# Patient Record
Sex: Male | Born: 1935 | Race: White | Hispanic: No | Marital: Married | Smoking: Former smoker
Health system: Southern US, Community
[De-identification: ages and names within clinical notes are randomized; demographics above are authoritative.]

## PROBLEM LIST (undated history)

## (undated) DIAGNOSIS — G7000 Myasthenia gravis without (acute) exacerbation: Secondary | ICD-10-CM

## (undated) DIAGNOSIS — K5710 Diverticulosis of small intestine without perforation or abscess without bleeding: Secondary | ICD-10-CM

## (undated) DIAGNOSIS — L0390 Cellulitis, unspecified: Secondary | ICD-10-CM

## (undated) DIAGNOSIS — I10 Essential (primary) hypertension: Secondary | ICD-10-CM

## (undated) HISTORY — PX: ABDOMINAL SURGERY: SHX537

## (undated) HISTORY — PX: CHOLECYSTECTOMY: SHX55

## (undated) HISTORY — PX: EYE SURGERY: SHX253

---

## 2000-08-06 ENCOUNTER — Ambulatory Visit (HOSPITAL_COMMUNITY): Admission: RE | Admit: 2000-08-06 | Discharge: 2000-08-06 | Payer: Self-pay | Admitting: *Deleted

## 2000-08-06 ENCOUNTER — Encounter: Payer: Self-pay | Admitting: *Deleted

## 2003-05-20 ENCOUNTER — Inpatient Hospital Stay (HOSPITAL_COMMUNITY): Admission: EM | Admit: 2003-05-20 | Discharge: 2003-05-24 | Payer: Self-pay | Admitting: Emergency Medicine

## 2003-05-24 ENCOUNTER — Encounter (INDEPENDENT_AMBULATORY_CARE_PROVIDER_SITE_OTHER): Payer: Self-pay | Admitting: Cardiology

## 2004-06-05 ENCOUNTER — Ambulatory Visit: Payer: Self-pay | Admitting: Family Medicine

## 2004-07-02 HISTORY — PX: SMALL INTESTINE SURGERY: SHX150

## 2004-07-10 ENCOUNTER — Ambulatory Visit: Payer: Self-pay | Admitting: Internal Medicine

## 2004-07-10 ENCOUNTER — Inpatient Hospital Stay (HOSPITAL_COMMUNITY): Admission: EM | Admit: 2004-07-10 | Discharge: 2004-07-18 | Payer: Self-pay | Admitting: Emergency Medicine

## 2004-07-13 ENCOUNTER — Encounter (INDEPENDENT_AMBULATORY_CARE_PROVIDER_SITE_OTHER): Payer: Self-pay | Admitting: *Deleted

## 2004-07-25 ENCOUNTER — Ambulatory Visit: Payer: Self-pay | Admitting: Family Medicine

## 2004-09-13 ENCOUNTER — Ambulatory Visit: Payer: Self-pay | Admitting: Family Medicine

## 2004-12-14 ENCOUNTER — Ambulatory Visit: Payer: Self-pay | Admitting: Family Medicine

## 2005-03-21 ENCOUNTER — Ambulatory Visit: Payer: Self-pay | Admitting: Family Medicine

## 2005-08-23 ENCOUNTER — Ambulatory Visit: Payer: Self-pay | Admitting: Family Medicine

## 2005-12-25 ENCOUNTER — Ambulatory Visit: Payer: Self-pay | Admitting: Family Medicine

## 2006-04-30 ENCOUNTER — Ambulatory Visit: Payer: Self-pay | Admitting: Family Medicine

## 2006-09-24 ENCOUNTER — Emergency Department (HOSPITAL_COMMUNITY): Admission: EM | Admit: 2006-09-24 | Discharge: 2006-09-25 | Payer: Self-pay | Admitting: Emergency Medicine

## 2006-09-25 ENCOUNTER — Ambulatory Visit: Payer: Self-pay | Admitting: Family Medicine

## 2006-09-30 ENCOUNTER — Ambulatory Visit: Payer: Self-pay | Admitting: Family Medicine

## 2006-10-10 ENCOUNTER — Encounter: Admission: RE | Admit: 2006-10-10 | Discharge: 2006-10-10 | Payer: Self-pay | Admitting: Surgery

## 2008-09-01 ENCOUNTER — Encounter (INDEPENDENT_AMBULATORY_CARE_PROVIDER_SITE_OTHER): Payer: Self-pay | Admitting: General Surgery

## 2008-09-01 ENCOUNTER — Inpatient Hospital Stay (HOSPITAL_COMMUNITY): Admission: EM | Admit: 2008-09-01 | Discharge: 2008-09-02 | Payer: Self-pay | Admitting: Emergency Medicine

## 2010-07-23 ENCOUNTER — Encounter: Payer: Self-pay | Admitting: Neurology

## 2010-10-12 LAB — DIFFERENTIAL
Basophils Relative: 0 % (ref 0–1)
Lymphocytes Relative: 8 % — ABNORMAL LOW (ref 12–46)
Lymphs Abs: 1.2 10*3/uL (ref 0.7–4.0)
Monocytes Absolute: 1 10*3/uL (ref 0.1–1.0)
Monocytes Relative: 7 % (ref 3–12)
Neutro Abs: 13 10*3/uL — ABNORMAL HIGH (ref 1.7–7.7)

## 2010-10-12 LAB — COMPREHENSIVE METABOLIC PANEL
Albumin: 3.4 g/dL — ABNORMAL LOW (ref 3.5–5.2)
Alkaline Phosphatase: 59 U/L (ref 39–117)
BUN: 7 mg/dL (ref 6–23)
Calcium: 8.5 mg/dL (ref 8.4–10.5)
Potassium: 3.1 mEq/L — ABNORMAL LOW (ref 3.5–5.1)
Total Protein: 6 g/dL (ref 6.0–8.3)

## 2010-10-12 LAB — URINALYSIS, ROUTINE W REFLEX MICROSCOPIC
Glucose, UA: NEGATIVE mg/dL
Hgb urine dipstick: NEGATIVE
Specific Gravity, Urine: 1.025 (ref 1.005–1.030)
pH: 7 (ref 5.0–8.0)

## 2010-10-12 LAB — CBC
HCT: 45 % (ref 39.0–52.0)
MCHC: 34.6 g/dL (ref 30.0–36.0)
Platelets: 312 10*3/uL (ref 150–400)
RDW: 13.8 % (ref 11.5–15.5)

## 2010-10-26 ENCOUNTER — Inpatient Hospital Stay (HOSPITAL_COMMUNITY)
Admission: EM | Admit: 2010-10-26 | Discharge: 2010-10-27 | DRG: 603 | Disposition: A | Discharge status: Home / Self Care | Payer: Medicare Other | Source: Ambulatory Visit | Attending: Emergency Medicine | Admitting: Emergency Medicine

## 2010-10-26 DIAGNOSIS — I10 Essential (primary) hypertension: Secondary | ICD-10-CM | POA: Diagnosis present

## 2010-10-26 DIAGNOSIS — L02419 Cutaneous abscess of limb, unspecified: Principal | ICD-10-CM | POA: Diagnosis present

## 2010-10-27 ENCOUNTER — Emergency Department (HOSPITAL_COMMUNITY): Payer: Medicare Other

## 2010-10-27 LAB — DIFFERENTIAL
Lymphs Abs: 3.8 10*3/uL (ref 0.7–4.0)
Monocytes Relative: 8 % (ref 3–12)
Neutro Abs: 9.2 10*3/uL — ABNORMAL HIGH (ref 1.7–7.7)
Neutrophils Relative %: 65 % (ref 43–77)

## 2010-10-27 LAB — CBC
Hemoglobin: 15.6 g/dL (ref 13.0–17.0)
MCH: 32.7 pg (ref 26.0–34.0)
MCV: 93.1 fL (ref 78.0–100.0)
RBC: 4.77 MIL/uL (ref 4.22–5.81)
WBC: 14.2 10*3/uL — ABNORMAL HIGH (ref 4.0–10.5)

## 2010-10-27 LAB — BASIC METABOLIC PANEL
BUN: 11 mg/dL (ref 6–23)
CO2: 30 mEq/L (ref 19–32)
Chloride: 100 mEq/L (ref 96–112)
Creatinine, Ser: 1.21 mg/dL (ref 0.4–1.5)
GFR calc Af Amer: >60 mL/min (ref >60)
Potassium: 3.8 mEq/L (ref 3.5–5.1)

## 2010-10-30 ENCOUNTER — Emergency Department (HOSPITAL_COMMUNITY)
Admission: EM | Admit: 2010-10-30 | Discharge: 2010-10-30 | Disposition: A | Discharge status: Home / Self Care | Payer: Medicare Other | Attending: Emergency Medicine | Admitting: Emergency Medicine

## 2010-10-30 DIAGNOSIS — I10 Essential (primary) hypertension: Secondary | ICD-10-CM | POA: Insufficient documentation

## 2010-10-30 DIAGNOSIS — L03119 Cellulitis of unspecified part of limb: Secondary | ICD-10-CM | POA: Insufficient documentation

## 2010-10-30 DIAGNOSIS — Z09 Encounter for follow-up examination after completed treatment for conditions other than malignant neoplasm: Secondary | ICD-10-CM | POA: Insufficient documentation

## 2010-10-30 DIAGNOSIS — L02419 Cutaneous abscess of limb, unspecified: Secondary | ICD-10-CM | POA: Insufficient documentation

## 2010-10-30 DIAGNOSIS — R609 Edema, unspecified: Secondary | ICD-10-CM | POA: Insufficient documentation

## 2010-11-14 NOTE — H&P (Signed)
NAME:  [name], [name]            ACCOUNT NO.:  [id number]   MEDICAL RECORD NO.:  [id number]          PATIENT TYPE:  INP   LOCATION:  5128                         FACILITY:  [facility]   PHYSICIAN:  Gabrielle Dare. Janee Morn, M.D.DATE OF BIRTH:  Sep 25, 1931   DATE OF ADMISSION:  09/01/2008  DATE OF DISCHARGE:                              HISTORY & PHYSICAL   ADMITTING PHYSICIAN:  Gabrielle Dare. Janee Morn, MD   PRIMARY CARE PHYSICIAN:  Delaney Meigs, [place], [place].   CHIEF COMPLAINT:  Right upper quadrant abdominal pain.   HISTORY OF PRESENT ILLNESS:  Mr. Lamadrid is a 76-year male patient with  history of hypertension and ocular myasthenia gravis, who presented to  the ER after being awakened with abrupt onset of right upper quadrant  and right middle quadrant abdominal pain around 3 a.m.  This pain was  not associated with nausea, vomiting, diarrhea, or fevers.  He has never  had this type of pain before.  After arrival to the ER, the patient was  medicated and his pain has resolved.  A CT of the abdomen and pelvis  revealed cholelithiasis and cholecystitis, also an incidental finding of  enlarged lower thoracic lymph nodes.  His white count was elevated and  he had slight elevation in LFTs as well.   REVIEW OF SYSTEMS:  As per the history of present illness.  GI:  Symptoms as above.  RESPIRATORY:  No cough.  CONSTITUTION:  He has had a  runny nose for the past 4 days.   PAST MEDICAL HISTORY:  1. Diverticulitis.  2. Hypertension.  3. Ocular myasthenia gravis, on chronic steroids.  4. Renal calculi.   PAST SURGICAL HISTORY:  Partial colectomy for perforated diverticulum by  Dr. Abbey Chatters.   FAMILY HISTORY:  Noncontributory.   SOCIAL HISTORY:  No alcohol.  No tobacco.  He is married.   DRUG ALLERGIES:  NKDA.   CURRENT MEDICATIONS:  Pyridostigmine bromide, prednisone, Avapro, and  amlodipine.  No dosages are available at the time of dictation.   PHYSICAL  EXAMINATION:  GENERAL:  A pleasant male patient reporting  resolved upper abdominal pain.  VITAL SIGNS:  Temperature 98.1, BP 160/78, pulse 74 and regular, and  respirations 22.  PSYCH:  The patient is alert and oriented x3.  Affect is appropriate to  current situation.  NEURO:  Cranial nerves II-XII are grossly intact.  He is moving all  extremities x4 without focal neurologic deficits.  EYES:  Sclerae are injected, nonicteric.  EARS, NOSE, AND THROAT:  Ears are symmetrical.  No otorrhea.  Nose is  midline.  No rhinorrhea.  Oral mucous membranes pink and moist.  CHEST:  Bilateral lung sounds are clear to auscultation.  Respiratory  effort is nonlabored.  He is on room air.  CARDIOVASCULAR:  Heart sounds S1 and S2.  No rubs, murmurs, or gallops.  No JVD.  No peripheral edema.  Pulses are regular, non-tachycardiac.  ABDOMEN:  Obese, nontender, and nondistended.  He has a tiny small  umbilical hernia which is soft, but does not reduce.  Bowel sounds are  present.  No  hepatosplenomegaly.  No Murphy sign.  EXTREMITIES:  Symmetrical in appearance without cyanosis or clubbing.   LABORATORY DATA:  White count 15,200, neutrophils 35%, hemoglobin 15.6,  and platelets 312,000.  Sodium 138, potassium 3.1, CO2 of 25, glucose  156, BUN 7, creatinine 1.06, and lipase 36.  CT again shows a distended  gallbladder with gallbladder wall thickening and stones, possible early  small bowel ileus in the small bowel adjacent to the neck of the  gallbladder, incidental finding of enlarged lower thoracic lymph nodes.   IMPRESSION:  1. Acute cholecystitis with cholelithiasis.  2. Hypertension, moderate controlled.  3. Ocular myasthenia gravis, on chronic prednisone.  4. Incidental finding of enlarged lower thoracic lymph nodes and      nonsmoker.   PLAN:  1. Admit the patient to General Surgical Floor.  Plan operative      intervention today.  Risks and benefits of laparoscopic      cholecystectomy are  explained to the patient per Dr. Janee Morn      including possibility and the patient may need to undergo open      procedure in the event, cannot be completed on laparoscopic      approach.  The patient and family verbalize understanding and      wished to proceed.  2. I agree with empiric Unasyn for enteric pathogen coverage ordered      by EDP.  In addition, we will treat symptoms with IV morphine and      Zofran as well as Protonix.  3. Because of the findings on the CT, we will order a CT of the chest      with IV contrast to better clarify the lymph nodes to see if this      is a pathological issue that needs to be further followed up by Dr.      Lysbeth Galas in the outpatient setting.      Allison L. Rennis Harding, N.P.      Gabrielle Dare Janee Morn, M.D.  Electronically Signed    ALE/MEDQ  D:  09/01/2008  T:  09/01/2008  Job:  [id number]   cc:   Delaney Meigs, M.D.

## 2010-11-14 NOTE — Op Note (Signed)
NAME:  PAZ, WINSETT            ACCOUNT NO.:  [id number]   MEDICAL RECORD NO.:  [id number]          PATIENT TYPE:  INP   LOCATION:  5128                         FACILITY:  [facility]   PHYSICIAN:  Gabrielle Dare. Janee Morn, M.D.DATE OF BIRTH:  02-Feb-1932   DATE OF PROCEDURE:  09/01/2008  DATE OF DISCHARGE:                               OPERATIVE REPORT   PREOPERATIVE DIAGNOSIS:  Acute cholecystitis.   POSTOPERATIVE DIAGNOSIS:  Acute cholecystitis.   PROCEDURE:  Laparoscopic cholecystectomy.   SURGEON:  Gabrielle Dare. Janee Morn, M.D.   ASSISTANT:  Cherylynn Ridges, M.D.   HISTORY OF PRESENT ILLNESS:  Mr. Vonbargen is a 77 year old gentleman  with a history of hypertension, myasthenia gravis who presented to the  emergency department at [facility] with acute onset of right upper  quadrant abdominal pain.  CT scan of the abdomen and pelvis demonstrated  multiple gallstones and evidence of acute cholecystitis. He was admitted  and brought urgently to the operating room for cholecystectomy.   PROCEDURE IN DETAIL:  Informed consent was obtained.  The patient  received intravenous antibiotics.  He was brought to the operating room  and general endotracheal anesthesia was administered by the Anesthesia  staff.  He was given stress dose steroid.  His abdomen was prepped and  draped in sterile fashion.  Due to his previous midline incision, a  small upper midline incision was made after infiltrating the area with  0.25% Marcaine with epinephrine.  Subcutaneous tissues were dissected  down revealing anterior fascia.  This was divided sharply and the  peritoneal cavity was entered under direct vision without difficulty.  A  0 Vicryl pursestring suture was placed around the fascial opening and a  Hasson trocar was inserted into the abdomen.  The abdomen was  insufflated with carbon dioxide in standard fashion under direct vision.  An 11-mm epigastric and two 5-mm lateral ports were placed.  Laparoscopic  exploration revealed a very inflamed gallbladder.  There  was a lot of omental adhesions to the gallbladder.  The dome of the  gallbladder was retracted superomedially.  The gallbladder tore quite  easily and it was very flimsy.  After the dome was retracted, the  omental adhesions were gradually swept down, achieving hemostasis with  Bovie cautery, which eventually revealed the body of the gallbladder,  which has been retracted laterally and then due to the patient's body  habitus and the inflammations around the infundibulum we placed a port  in the right mid abdomen and a liver retractor was inserted.  This  helped to retract things around the infundibulum.  Further dissection  continued down revealing the infundibulum and the dissection began  laterally and progressing more medially and this allowed [place] to identify  the cystic duct.  We stayed right along the gallbladder in the  infundibular cystic duct junction.  The dissection continued until a  large window was created between the gallbladder and the cystic duct and  the liver.  Once we had excellent visualization, the tissues remained  quite friable so we did not feel it would be safe to do a cholangiogram.  The cystic  duct was clipped and initially 3 times proximally, and then  we put 2 more clips to secure the closure and it was divided.  There was  excellent closure with no leakage.  The cystic artery was then dissected  out, clipped twice proximally, once distally, and divided.  The  gallbladder was taken off the liver bed with Bovie cautery achieving  good hemostasis along the way.  Gallbladder was placed in an EndoCatch  bag and taken off the abdomen via the upper midline port site.  Liver  bed was copiously irrigated and hemostasis was achieved with Bovie  cautery.  There was one other area on the lower liver edge an adhesion  attachment that was still bleeding.  This was cauterized and then  Surgicel was placed there,  achieving excellent hemostasis.  Liver bed  was rechecked and remained dry.  The clips remained in good position.  There was no further bleeding or evidence of bile leakage.  A #19 [place]  Blake drain was placed into the liver bed and brought out through the  lateral port site.  The pneumoperitoneum was released.  The ports were  removed under direct vision.  The midline fascia was closed by tying  with 0-Vicryl pursestring suture, all 4 port sites were copiously  irrigated and the skin was then closed with running 4-0 Vicryl  subcuticular stitch and by Dermabond and the drain was secured with a 2-  0 nylon suture.  The patient tolerated the procedure well without  apparent complications.  The patient was taken to recovery room in  stable condition.      Gabrielle Dare Janee Morn, M.D.  Electronically Signed     BET/MEDQ  D:  09/01/2008  T:  09/02/2008  Job:  [id number]   cc:   Delaney Meigs, M.D.
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
## 2010-11-17 NOTE — H&P (Signed)
NAME:  [name], [name]                        ACCOUNT NO.:  [id number]   MEDICAL RECORD NO.:  [id number]                   PATIENT TYPE:  EMS   LOCATION:  MAJO                                 FACILITY:  [facility]   PHYSICIAN:  Alfonse Spruce, M.D.               DATE OF BIRTH:  02-09-1932   DATE OF ADMISSION:  05/20/2003  DATE OF DISCHARGE:                                HISTORY & PHYSICAL   AGE:  A 77 year old white male.   PHYSICIAN:  1. Dr. Kelli Hope.  2. Urologist/primary care doctor from [facility], [address], [place], [place] [address], [phone].   Patient was brought in by ambulance with the chief complaint of shortness of  breath, sore throat followed by significant copious amounts of secretions  coming from his trachea and lung and a feeling of drowning in his secretion  coming from the bronchial tree.   DICTATION ENDED AT THIS POINT                                                Alfonse Spruce, M.D.    Wynn Maudlin  D:  05/20/2003  T:  05/21/2003  Job:  [id number]

## 2010-11-17 NOTE — Op Note (Signed)
NAMEJOELLE, FLESSNER            ACCOUNT NO.:  [id number]   MEDICAL RECORD NO.:  [id number]          PATIENT TYPE:  INP   LOCATION:  5706                         FACILITY:  [facility]   PHYSICIAN:  Adolph Pollack, M.D.DATE OF BIRTH:  1932/06/23   DATE OF PROCEDURE:  07/13/2004  DATE OF DISCHARGE:                                 OPERATIVE REPORT   PREOPERATIVE DIAGNOSIS:  Small bowel perforation.   POSTOPERATIVE DIAGNOSIS:  Small bowel perforation.   PROCEDURE:  Exploratory laparotomy, small bowel resection.   SURGEON:  Adolph Pollack, M.D.   ANESTHESIA:  General.   INDICATIONS:  This is a 77 year old male admitted July 10, 2004, with a  nonspecific enteritis.  Interval CT scan was performed today and showed a  focal small bowel perforation.  He now presents to the operating room for  emergency small bowel resection.   TECHNIQUE:  He was seen in the holding area, then brought to the operating  room and placed supine on the operating table and a general anesthetic was  administered.  A Foley catheter was placed in the bladder and an NG tube  inserted.  The hair on the abdominal wall was clipped and the abdominal wall  was sterilely prepped and draped.  A midline incision was made, incising  skin, subcutaneous tissue, fascia, and peritoneum.  Once entering the  peritoneal cavity, the NG tube position was confirmed.  The abdomen was  explored.  In the left abdomen was a firm mass with omentum adherent to it.  I was able to bluntly separate the omentum from an area of erythematous  small bowel with surrounding exudate and a small perforation that was  contained.  Proximally and distally to this were what appeared to be normal  small bowel.  There appeared to be fairly good blood flow.  I could not tell  if this was small bowel diverticulitis or not.  There was a firm area of  either induration or mass on the mesenteric side of the small bowel.   I divided the small bowel  proximally and distally to the area of perforation  and then divided the mesenteric vessels between clamps and ligated them.  The specimen was then handed off the field and sent to pathology.  Intestinal continuity was restored by way of a side-to-side anastomosis with  the remaining enterotomy closed with the linear, noncutting stapler.  The  anastomosis was in a stapled fashion.  The anastomosis was patent and viable  and under no tension.  The mesenteric defect was then closed with  interrupted 3-0 Vicryl sutures.  The distal staple line of the anastomosis  was reinforced with a single 3-0 Vicryl suture.   Following this, the abdominal cavity was irrigated and fluid evacuated.  No  other small bowel pathology was noted.  The omentum was placed over the  intestine and the fascia closed with a running #1 PDS suture.  The sponge,  needle, and instrument counts were reported to be correct.  The subcutaneous  tissue was irrigated and the skin was closed with staples.   He tolerated the procedure well  without any apparent complications and was  taken to the recovery room in satisfactory condition.      Todd   TJR/MEDQ  D:  07/13/2004  T:  07/13/2004  Job:  [id number]

## 2010-11-17 NOTE — Consult Note (Signed)
NAMECHRISTON, PARADA            ACCOUNT NO.:  [id number]   MEDICAL RECORD NO.:  [id number]          PATIENT TYPE:  INP   LOCATION:  1844                         FACILITY:  [facility]   PHYSICIAN:  Cherylynn Ridges III, M.D.DATE OF BIRTH:  July 07, 1931   DATE OF CONSULTATION:  07/10/2004  DATE OF DISCHARGE:                                   CONSULTATION   Dear Dr. Darrick Huntsman:   Thank you very much for asking me to see Mr. Canner, a 77 year old male  with a history of myasthenia gravis on pyridostigmine and prednisone, also  with a history of hypertension, who has had about a 12 to 15-hour history of  abdominal pain, mostly on the left side.  This started while the patient was  out hunting at about 6 o'clock this morning.  It got so severe that he could  no longer continue to hunt at first, lay down in the field, improved  somewhat to where he could hunt, and then he came home later on with  continuing abdominal pain.  He has had no nausea, vomiting, no fevers or  chills.  He has never had pain like this before.  No passage of blood in his  stool or his urine.   PAST MEDICAL HISTORY:  1.  Myasthenia gravis.  2.  Hypertension.  3.  Status post knee surgery in the past.  4.  Known history of gallstones.   MEDICATIONS:  He takes Avapro, Norvasc, pyridostigmine, and prednisone.   ALLERGIES:  No known drug allergies by his history, but apparently in the  chart is documented he has some type of reaction to VERAPAMIL.   REVIEW OF SYSTEMS:  Again, no fevers or chills, no nausea or vomiting.  He  has had just abdominal pain.  Currently his pain is now a 2/10; he came in  as a 10/10, and he had a fever up to 102.0.  At the time I saw the patient,  he was afebrile.  His pain was subsided, but I am unsure what type of  medicines he has received for relief.  He does have a bag of Unasyn running  in for I guess empiric treatment of some type of intra-abdominal  inflammatory process.   PHYSICAL  EXAMINATION:  VITAL SIGNS:  He is afebrile.  Vitals signs are  stable.  LUNGS:  Clear.  ABDOMEN:  Soft.  He has hypoactive but present bowel sounds.  There are no  palpable masses.  He has some mild tenderness in the left lower quadrant, no  rebound, no guarding.  RECTAL:  No done.   LABORATORY DATA AND OTHER STUDIES:  UA is negative.  White count 22,000,  hemoglobin about 16, hematocrit about 46, platelets are up also.  Electrolytes are within normal limits.   IMPRESSION:  Although the patient has had a fever and elevated white count,  cannot pinpoint this to any specific intra-abdominal process at this time.  He has a thickened small-bowel loop on CT scan, perhaps some type of  enteritis or maybe even ischemic enteritis; however, it is not severe enough  clinically or based on  CT findings for an operative intervention.  He has no  free fluid or free air.  I do think that the Unasyn is a good idea for  empiric treatment; however, he requires close examination and very little  pain medicine in case this is something that is going to worsen over time.   The fact that the patient is on prednisone probably does blunt his pain  response and also possibly some of his systemic responses. We have to keep a  careful eye on him in case he worsens quickly.  Currently, no operative  intervention is planned.  He will go on the general surgery service, [facility].      Jame   JOW/MEDQ  D:  07/10/2004  T:  07/10/2004  Job:  [id number]   cc:   Duncan Dull, M.D.  Fax: [phone]

## 2010-11-17 NOTE — Discharge Summary (Signed)
NAMEZEB, RAWL            ACCOUNT NO.:  [id number]   MEDICAL RECORD NO.:  [id number]          PATIENT TYPE:  INP   LOCATION:  5706                         FACILITY:  [facility]   PHYSICIAN:  Duncan Dull, M.D.     DATE OF BIRTH:  Nov 18, 1931   DATE OF ADMISSION:  07/10/2004  DATE OF DISCHARGE:  07/18/2004                                 DISCHARGE SUMMARY   DISCHARGE DIAGNOSES:  1.  Small bowel perforation due to diverticular disease.  2.  Myasthenia gravis.  3.  Hypertension.  4.  History of cholelithiasis.  5.  Status post skin graft on right thigh.  6.  Erectile dysfunction.   DISCHARGE MEDICATIONS:  1.  Cipro 500 mg one p.o. b.i.d. for 5 days.  2.  Flagyl 500 mg one p.o. t.i.d. for 5 days.  3.  Prednisone.  The patient is to take 40 mg on day of discharge, 20 mg on      day after discharge, and then resume home dose of 10 mg once daily.  4.  Pyridostigmine 60 mg p.o. q.i.d.  5.  Norvasc 5 mg daily.  6.  Avapro 150 mg daily.  7.  Aspirin 81 mg to be restarted after his surgery followup.  8.  Vicodin one pill q.4 h. p.r.n. pain, dispensed 20.   DISPOSITION AND FOLLOWUP:  The patient will call Dr. Maris Berger office at  [phone] next week for staple removal and will call Dr. Lysbeth Galas for a  followup appointment in 1 week.  At that time, it should just be assessed  how well he is tolerating the restart of his antihypertensive medications  with his myasthenia gravis medications.  Please note the patient does have a  mild leukocytosis secondary to surgery with stress-dose steroids while in  the hospital, and if Dr. Lysbeth Galas feels appropriate, then a CBC may be ordered  at that time.   PROCEDURES PERFORMED:  1.  CT scan of the abdomen and pelvis performed July 10, 2004, showing      bowel wall thickening associated with distal small bowel loops without      specific findings.  Cholelithiasis.  Bilateral renal cysts.  Sigmoid      colonic diverticulosis without evidence for  diverticulitis.  An enlarged      prostate gland with a small right inguinal hernia containing fat.  2.  CT scan of the abdomen and pelvis on July 13, 2004, showing improved      small bowel wall thickening except for an area of a loop of jejunum in      the left lower abdomen with interval development of a small contained      perforation at that location with adjacent inflammatory changes to the      mesenteric fat with an adjacent small diverticulum superiorly consistent      with small bowel diverticulitis which is perforated neoplasm as well as      focal ischemic enteritis.  3.  Exploratory laparotomy with small bowel resection performed July 13, 2004, by Dr. Abbey Chatters with findings consistent with small bowel  resection and negative pathology.   BRIEF ADMISSION HISTORY AND PHYSICAL:  The patient is a 77 year old white  male with a past medical history as described above who presented with acute  onset of left lower quadrant abdominal pain.  The pain began around 6 a.m.  on the morning of admission with radiation across the lower abdomen, and the  patient denies any exacerbating or relieving factors.  The pain was severe  and caused the patient to feel as though he was going to pass out.  No  nausea, vomiting, anorexia, diarrhea, or melena.  No hematochezia.  Last  bowel movement, which was well formed, was on the morning of admission,  about an hour prior to the pain beginning.   Vital signs on admission:  Temperature 102.0 degrees, pulse 92, blood  pressure 127/75, respiratory rate 20, and 02 saturation 95% on room air.  General:  Well-developed, well-nourished white male in no acute distress.  HEENT:  Pupils equal, round, and reactive to light and accommodation without  icterus.  Oropharynx clear.  Neck:  Supple, without JVD or bruit.  Chest:  Respiratory exam showed crackles in bilateral bases with wheezing at right  base.  Heart:  Cardiac exam showed regular  rate and rhythm without murmurs.  No bruits.  Peripheral pulses 2+.  Abdomen:  Hyperactive bowel sounds.  Soft.  Tender in the left lower quadrant with some guarding but without  rebound.  Extremities:  No edema.  Rectal:  No stool.  Vault was hemoccult-  negative.  Neurologic:  Nonfocal.   Admission labs showed a white count of 22.0, hemoglobin 15.7, platelets 310.  B-MET showed a sodium of 137, potassium 3.9, chloride 104, BUN 9, creatinine  1.2, and glucose 120.  The lactic acid level was normal at 1.1.  The UA was  negative.   HOSPITAL COURSE BY ACTIVE PROBLEM:  1.  Small bowel perforation secondary to diverticulitis.  The patient was      admitted from the emergency room to the general medicine teaching      service, and surgery was immediately consulted.  With the initial CT      findings nonspecific, we initially treated with a course of bowel rest      and strictly IV fluids, and the patient felt significantly better on the      day after admission.  He was started on Cipro and Flagyl IV, and his      white count dropped from 22 to 18.0.  The plan was to continue bowel      rest with antibiotics and see how he improved.  The patient felt so much      better that he was tried on clear liquids on the night of January 10 but      then had significant left lower quadrant pain again.  He was placed back      on n.p.o., and surgery felt this was possibly due to transient ischemia,      diverticulitis, or nonspecific diverticulitis, and the plan was to      repeat the CT scan on July 13, 2004.  A CT scan was repeated on      July 13, 2004, with the patient feeling better but with the findings      descried above, and he was immediately taken to surgery for exploratory      laparotomy with small bowel resection.   After the procedure, the patient felt well but did not have  any flatus or bowel movements until postoperative day #4.  At that time, he had 3 bowel  movements and felt  well, and his white count was down to 11.2.  Clear  liquids were restarted, and he tolerated this well.  His diet was advanced.   On the day of discharge, his white count was 11.6, and he was afebrile with  T-max of 98.7 degrees.  He will follow up in 1 week to have his sutures  removed and will call the surgeon if he has any difficulty with his  incision.   1.  History of myasthenia gravis.  The patient was initially held off his      pyridostigmine but was continued on his prednisone.  He did not have any      symptoms of myasthenia throughout his admission.  Multiple changes were      made to his medications to accommodate his myasthenia as he was given      perioperative beta blockers, but this was stopped as quickly as      possible, and his antibiotics were changed to Primaxin.  Secondary      concerns were fluoroquinolones exacerbating myasthenia.  The patient      continued to do fine and had no problems, so all of his medications were      re-started at discharge.   1.  Hypertension.  The patient was initially managed with the perioperative      beta blockers and then with an IV ACE inhibitor.  On the day prior to      discharge, he was restarted on his calcium channel blocker and his      Avapro.  His blood pressure on the day of discharge was 144/71.   LABORATORY DATA:  Lab results on the day of discharge showed the patient's  white count was 11.6, hemoglobin 14.4, platelets 382.  His B-MET showed a  sodium of 140, potassium 3.6, chloride 107, CO2 26, glucose 135, BUN 10,  creatinine 1.1.  Of note, he had a hepatitis panel checked because he had  mildly elevated LFTs on admission, and it was negative.  A fasting lipid  profile was checked on July 17, 2004, with the following results:  Total  cholesterol 97, triglycerides 49, HDL 32, LDL 55.  His pathology was  negative for malignancy and only showed ulceration and associated abscess  formation focally with a  diverticulum.       WW/MEDQ  D:  07/18/2004  T:  07/18/2004  Job:  [id number]   cc:   Delaney Meigs, M.D.  [address]  [place]  [place] [address]  Fax: [phone]   Adolph Pollack, M.D.  [address]  [place]  [place] [address]  Fax: [phone]

## 2011-02-28 ENCOUNTER — Encounter (INDEPENDENT_AMBULATORY_CARE_PROVIDER_SITE_OTHER): Payer: Medicare Other | Admitting: Ophthalmology

## 2011-02-28 DIAGNOSIS — H43819 Vitreous degeneration, unspecified eye: Secondary | ICD-10-CM

## 2011-02-28 DIAGNOSIS — H401390 Pigmentary glaucoma, unspecified eye, stage unspecified: Secondary | ICD-10-CM

## 2011-02-28 DIAGNOSIS — H3530 Unspecified macular degeneration: Secondary | ICD-10-CM

## 2011-02-28 DIAGNOSIS — H35359 Cystoid macular degeneration, unspecified eye: Secondary | ICD-10-CM

## 2011-03-21 ENCOUNTER — Encounter (INDEPENDENT_AMBULATORY_CARE_PROVIDER_SITE_OTHER): Payer: Medicare Other | Admitting: Ophthalmology

## 2011-03-21 DIAGNOSIS — H401390 Pigmentary glaucoma, unspecified eye, stage unspecified: Secondary | ICD-10-CM

## 2011-03-21 DIAGNOSIS — H3530 Unspecified macular degeneration: Secondary | ICD-10-CM

## 2011-03-21 DIAGNOSIS — H35359 Cystoid macular degeneration, unspecified eye: Secondary | ICD-10-CM

## 2011-03-21 DIAGNOSIS — H2700 Aphakia, unspecified eye: Secondary | ICD-10-CM

## 2011-05-02 ENCOUNTER — Encounter (INDEPENDENT_AMBULATORY_CARE_PROVIDER_SITE_OTHER): Payer: Medicare Other | Admitting: Ophthalmology

## 2011-05-02 DIAGNOSIS — H21239 Degeneration of iris (pigmentary), unspecified eye: Secondary | ICD-10-CM

## 2011-05-02 DIAGNOSIS — H3530 Unspecified macular degeneration: Secondary | ICD-10-CM

## 2011-05-02 DIAGNOSIS — H43819 Vitreous degeneration, unspecified eye: Secondary | ICD-10-CM

## 2011-05-02 DIAGNOSIS — H35359 Cystoid macular degeneration, unspecified eye: Secondary | ICD-10-CM

## 2011-06-13 ENCOUNTER — Encounter (INDEPENDENT_AMBULATORY_CARE_PROVIDER_SITE_OTHER): Payer: Medicare Other | Admitting: Ophthalmology

## 2011-06-13 DIAGNOSIS — H35359 Cystoid macular degeneration, unspecified eye: Secondary | ICD-10-CM

## 2011-06-13 DIAGNOSIS — H3530 Unspecified macular degeneration: Secondary | ICD-10-CM

## 2011-06-13 DIAGNOSIS — H21239 Degeneration of iris (pigmentary), unspecified eye: Secondary | ICD-10-CM

## 2011-06-13 DIAGNOSIS — H43819 Vitreous degeneration, unspecified eye: Secondary | ICD-10-CM

## 2011-06-13 DIAGNOSIS — H2700 Aphakia, unspecified eye: Secondary | ICD-10-CM

## 2011-07-25 ENCOUNTER — Encounter (INDEPENDENT_AMBULATORY_CARE_PROVIDER_SITE_OTHER): Payer: Medicare Other | Admitting: Ophthalmology

## 2011-07-25 DIAGNOSIS — H35359 Cystoid macular degeneration, unspecified eye: Secondary | ICD-10-CM

## 2011-07-25 DIAGNOSIS — I10 Essential (primary) hypertension: Secondary | ICD-10-CM

## 2011-07-25 DIAGNOSIS — H43819 Vitreous degeneration, unspecified eye: Secondary | ICD-10-CM

## 2011-07-25 DIAGNOSIS — H35039 Hypertensive retinopathy, unspecified eye: Secondary | ICD-10-CM

## 2011-09-05 ENCOUNTER — Encounter (INDEPENDENT_AMBULATORY_CARE_PROVIDER_SITE_OTHER): Payer: Medicare Other | Admitting: Ophthalmology

## 2011-09-05 DIAGNOSIS — I10 Essential (primary) hypertension: Secondary | ICD-10-CM

## 2011-09-05 DIAGNOSIS — H35039 Hypertensive retinopathy, unspecified eye: Secondary | ICD-10-CM

## 2011-09-05 DIAGNOSIS — H2700 Aphakia, unspecified eye: Secondary | ICD-10-CM

## 2011-09-05 DIAGNOSIS — H35359 Cystoid macular degeneration, unspecified eye: Secondary | ICD-10-CM

## 2011-09-05 DIAGNOSIS — H43819 Vitreous degeneration, unspecified eye: Secondary | ICD-10-CM

## 2011-09-05 DIAGNOSIS — H3530 Unspecified macular degeneration: Secondary | ICD-10-CM

## 2011-12-06 ENCOUNTER — Encounter (INDEPENDENT_AMBULATORY_CARE_PROVIDER_SITE_OTHER): Payer: Medicare Other | Admitting: Ophthalmology

## 2011-12-06 DIAGNOSIS — H2700 Aphakia, unspecified eye: Secondary | ICD-10-CM

## 2011-12-06 DIAGNOSIS — H35039 Hypertensive retinopathy, unspecified eye: Secondary | ICD-10-CM

## 2011-12-06 DIAGNOSIS — I10 Essential (primary) hypertension: Secondary | ICD-10-CM

## 2011-12-06 DIAGNOSIS — H3530 Unspecified macular degeneration: Secondary | ICD-10-CM

## 2011-12-06 DIAGNOSIS — H35359 Cystoid macular degeneration, unspecified eye: Secondary | ICD-10-CM

## 2011-12-06 DIAGNOSIS — H43819 Vitreous degeneration, unspecified eye: Secondary | ICD-10-CM

## 2011-12-13 ENCOUNTER — Inpatient Hospital Stay (HOSPITAL_COMMUNITY)
Admission: EM | Admit: 2011-12-13 | Discharge: 2011-12-15 | DRG: 603 | Disposition: A | Discharge status: Home / Self Care | Payer: Medicare Other | Source: Ambulatory Visit | Attending: Internal Medicine | Admitting: Internal Medicine

## 2011-12-13 ENCOUNTER — Encounter (HOSPITAL_COMMUNITY): Payer: Self-pay | Admitting: Emergency Medicine

## 2011-12-13 ENCOUNTER — Emergency Department (HOSPITAL_COMMUNITY): Payer: Medicare Other

## 2011-12-13 DIAGNOSIS — L03119 Cellulitis of unspecified part of limb: Secondary | ICD-10-CM | POA: Diagnosis present

## 2011-12-13 DIAGNOSIS — I10 Essential (primary) hypertension: Secondary | ICD-10-CM | POA: Diagnosis present

## 2011-12-13 DIAGNOSIS — L02519 Cutaneous abscess of unspecified hand: Secondary | ICD-10-CM

## 2011-12-13 DIAGNOSIS — Z79899 Other long term (current) drug therapy: Secondary | ICD-10-CM

## 2011-12-13 DIAGNOSIS — G7000 Myasthenia gravis without (acute) exacerbation: Secondary | ICD-10-CM | POA: Diagnosis present

## 2011-12-13 DIAGNOSIS — L0390 Cellulitis, unspecified: Secondary | ICD-10-CM

## 2011-12-13 HISTORY — DX: Cellulitis, unspecified: L03.90

## 2011-12-13 HISTORY — DX: Essential (primary) hypertension: I10

## 2011-12-13 HISTORY — DX: Myasthenia gravis without (acute) exacerbation: G70.00

## 2011-12-13 LAB — DIFFERENTIAL
Basophils Relative: 0 % (ref 0–1)
Eosinophils Absolute: 0 10*3/uL (ref 0.0–0.7)
Eosinophils Absolute: 0 10*3/uL (ref 0.0–0.7)
Eosinophils Relative: 0 % (ref 0–5)
Lymphocytes Relative: 25 % (ref 12–46)
Lymphs Abs: 3.6 10*3/uL (ref 0.7–4.0)
Monocytes Absolute: 1.3 10*3/uL — ABNORMAL HIGH (ref 0.1–1.0)
Monocytes Absolute: 1 10*3/uL (ref 0.1–1.0)
Monocytes Relative: 8 % (ref 3–12)
Neutrophils Relative %: 76 % (ref 43–77)

## 2011-12-13 LAB — COMPREHENSIVE METABOLIC PANEL
ALT: 104 U/L — ABNORMAL HIGH (ref 0–53)
AST: 211 U/L — ABNORMAL HIGH (ref 0–37)
CO2: 27 mEq/L (ref 19–32)
Calcium: 8.4 mg/dL (ref 8.4–10.5)
GFR calc non Af Amer: 63 mL/min — ABNORMAL LOW (ref >90)
Potassium: 3.6 mEq/L (ref 3.5–5.1)
Sodium: 140 mEq/L (ref 135–145)

## 2011-12-13 LAB — CBC
HCT: 43.6 % (ref 39.0–52.0)
HCT: 42.2 % (ref 39.0–52.0)
Hemoglobin: 14.1 g/dL (ref 13.0–17.0)
MCH: 32.6 pg (ref 26.0–34.0)
MCH: 31.5 pg (ref 26.0–34.0)
MCHC: 33.4 g/dL (ref 30.0–36.0)
MCV: 93.6 fL (ref 78.0–100.0)
Platelets: 322 10*3/uL (ref 150–400)
RBC: 4.66 MIL/uL (ref 4.22–5.81)
WBC: 14.2 10*3/uL — ABNORMAL HIGH (ref 4.0–10.5)

## 2011-12-13 LAB — POCT I-STAT, CHEM 8
Chloride: 102 mEq/L (ref 96–112)
HCT: 45 % (ref 39.0–52.0)
Hemoglobin: 15.3 g/dL (ref 13.0–17.0)
Potassium: 3.5 mEq/L (ref 3.5–5.1)
Sodium: 142 mEq/L (ref 135–145)

## 2011-12-13 LAB — GLUCOSE, CAPILLARY
Glucose-Capillary: 247 mg/dL — ABNORMAL HIGH (ref 70–99)
Glucose-Capillary: 157 mg/dL — ABNORMAL HIGH (ref 70–99)

## 2011-12-13 MED ORDER — BRIMONIDINE TARTRATE 0.2 % OP SOLN
1.0000 [drp] | Freq: Two times a day (BID) | OPHTHALMIC | Status: DC
  Administered 2011-12-13 – 2011-12-15 (×4): 1 [drp] via OPHTHALMIC
  Filled 2011-12-13: qty 5

## 2011-12-13 MED ORDER — BRIMONIDINE TARTRATE-TIMOLOL 0.2-0.5 % OP SOLN: 1.0000 [drp] | Freq: Two times a day (BID) | OPHTHALMIC | Status: DC

## 2011-12-13 MED ORDER — HYDROCORTISONE SOD SUCCINATE 100 MG IJ SOLR
INTRAMUSCULAR | Status: AC
  Filled 2011-12-13: qty 2

## 2011-12-13 MED ORDER — PIPERACILLIN-TAZOBACTAM 3.375 G IVPB
3.3750 g | Freq: Once | INTRAVENOUS | Status: AC
Start: 2011-12-13 — End: 2011-12-13
  Administered 2011-12-13: 3.375 g via INTRAVENOUS
  Filled 2011-12-13: qty 50

## 2011-12-13 MED ORDER — ACETAMINOPHEN 650 MG RE SUPP: 650.0000 mg | Freq: Four times a day (QID) | RECTAL | Status: DC | PRN

## 2011-12-13 MED ORDER — HYDROMORPHONE HCL PF 1 MG/ML IJ SOLN: 0.5000 mg | INTRAMUSCULAR | Status: DC | PRN

## 2011-12-13 MED ORDER — FUROSEMIDE 20 MG PO TABS
20.0000 mg | ORAL_TABLET | Freq: Every day | ORAL | Status: DC
  Administered 2011-12-14 – 2011-12-15 (×2): 20 mg via ORAL
  Filled 2011-12-13 (×3): qty 1

## 2011-12-13 MED ORDER — IRBESARTAN 150 MG PO TABS
150.0000 mg | ORAL_TABLET | Freq: Every day | ORAL | Status: DC
  Administered 2011-12-13 – 2011-12-14 (×2): 150 mg via ORAL
  Filled 2011-12-13 (×3): qty 1

## 2011-12-13 MED ORDER — VANCOMYCIN HCL IN DEXTROSE 1-5 GM/200ML-% IV SOLN
1000.0000 mg | Freq: Two times a day (BID) | INTRAVENOUS | Status: DC
  Administered 2011-12-13 – 2011-12-14 (×2): 1000 mg via INTRAVENOUS
  Filled 2011-12-13 (×3): qty 200

## 2011-12-13 MED ORDER — ONDANSETRON HCL 4 MG/2ML IJ SOLN
4.0000 mg | Freq: Once | INTRAMUSCULAR | Status: AC
  Administered 2011-12-13: 4 mg via INTRAVENOUS
  Filled 2011-12-13: qty 2

## 2011-12-13 MED ORDER — VANCOMYCIN HCL IN DEXTROSE 1-5 GM/200ML-% IV SOLN
1000.0000 mg | Freq: Once | INTRAVENOUS | Status: AC
  Administered 2011-12-13: 1000 mg via INTRAVENOUS
  Filled 2011-12-13: qty 200

## 2011-12-13 MED ORDER — PIPERACILLIN-TAZOBACTAM 3.375 G IVPB
3.3750 g | Freq: Three times a day (TID) | INTRAVENOUS | Status: DC
Start: 2011-12-13 — End: 2011-12-14
  Administered 2011-12-13 – 2011-12-14 (×3): 3.375 g via INTRAVENOUS
  Filled 2011-12-13 (×5): qty 50

## 2011-12-13 MED ORDER — HYDROMORPHONE HCL PF 1 MG/ML IJ SOLN
1.0000 mg | Freq: Once | INTRAMUSCULAR | Status: AC
  Administered 2011-12-13: 1 mg via INTRAMUSCULAR
  Filled 2011-12-13: qty 1

## 2011-12-13 MED ORDER — ACETAMINOPHEN 325 MG PO TABS: 650.0000 mg | ORAL_TABLET | Freq: Four times a day (QID) | ORAL | Status: DC | PRN

## 2011-12-13 MED ORDER — NEPAFENAC 0.1 % OP SUSP
3.0000 [drp] | Freq: Three times a day (TID) | OPHTHALMIC | Status: DC
  Administered 2011-12-13 – 2011-12-15 (×3): 3 [drp] via OPHTHALMIC

## 2011-12-13 MED ORDER — HYDROCORTISONE SOD SUCCINATE 100 MG PF FOR IT USE
50.0000 mg | Freq: Once | INTRAMUSCULAR | Status: AC
  Administered 2011-12-13: 50 mg via INTRATHECAL
  Filled 2011-12-13: qty 0.5

## 2011-12-13 MED ORDER — AMLODIPINE BESYLATE 5 MG PO TABS
5.0000 mg | ORAL_TABLET | Freq: Every day | ORAL | Status: DC
  Administered 2011-12-14 – 2011-12-15 (×2): 5 mg via ORAL
  Filled 2011-12-13 (×3): qty 1

## 2011-12-13 MED ORDER — PREDNISONE 10 MG PO TABS
10.0000 mg | ORAL_TABLET | Freq: Every day | ORAL | Status: DC
  Administered 2011-12-14 – 2011-12-15 (×2): 10 mg via ORAL
  Filled 2011-12-13 (×4): qty 1

## 2011-12-13 MED ORDER — CLINDAMYCIN PHOSPHATE 600 MG/50ML IV SOLN
600.0000 mg | Freq: Three times a day (TID) | INTRAVENOUS | Status: DC
  Administered 2011-12-13 – 2011-12-14 (×3): 600 mg via INTRAVENOUS
  Filled 2011-12-13 (×6): qty 50

## 2011-12-13 MED ORDER — SODIUM CHLORIDE 0.9 % IV SOLN
INTRAVENOUS | Status: DC
  Administered 2011-12-13 – 2011-12-14 (×2): via INTRAVENOUS

## 2011-12-13 MED ORDER — PREDNISOLONE ACETATE 1 % OP SUSP
1.0000 [drp] | Freq: Four times a day (QID) | OPHTHALMIC | Status: DC
  Administered 2011-12-13 – 2011-12-15 (×3): 1 [drp] via OPHTHALMIC
  Filled 2011-12-13: qty 1

## 2011-12-13 MED ORDER — ONDANSETRON HCL 4 MG/2ML IJ SOLN: 4.0000 mg | Freq: Four times a day (QID) | INTRAMUSCULAR | Status: DC | PRN

## 2011-12-13 MED ORDER — TIMOLOL MALEATE 0.5 % OP SOLN
1.0000 [drp] | Freq: Two times a day (BID) | OPHTHALMIC | Status: DC
  Administered 2011-12-13 – 2011-12-15 (×4): 1 [drp] via OPHTHALMIC
  Filled 2011-12-13: qty 5

## 2011-12-13 MED ORDER — ONDANSETRON HCL 4 MG PO TABS: 4.0000 mg | ORAL_TABLET | Freq: Four times a day (QID) | ORAL | Status: DC | PRN

## 2011-12-13 NOTE — Care Management Note (Signed)
    Page 1 of 1   12/17/2011     11:01:53 AM   CARE MANAGEMENT NOTE 12/17/2011  Patient:  [name], [name]   Account Number:  [id number]  Date Initiated:  12/13/2011  Documentation initiated by:  Letha Cape  Subjective/Objective Assessment:   dx cellulitis of hand  admit- lives with spouse. pta indepdent.     Action/Plan:   Anticipated DC Date:  12/15/2011   Anticipated DC Plan:  HOME/SELF CARE      DC Planning Services  CM consult      Choice offered to / List presented to:             Status of service:  Completed, signed off Medicare Important Message given?   (If response is "NO", the following Medicare IM given date fields will be blank) Date Medicare IM given:   Date Additional Medicare IM given:    Discharge Disposition:  HOME/SELF CARE  Per UR Regulation:  Reviewed for med. necessity/level of care/duration of stay  If discussed at Long Length of Stay Meetings, dates discussed:    Comments:  12/17/11 11:01 Letha Cape RN, BSN [phone] patient dc over w/e.  12/13/11 11:52 Letha Cape RN, BSN [phone] patient from home, pta independent.  Patient has medication coverage and transportation.  NCM will continue to follow for dc needs.

## 2011-12-13 NOTE — H&P (Signed)
[name] is an 78 y.o. male.   PCP - Dr.Nyland. Chief Complaint: Left hand pain and swelling. 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 It started off 2 weeks ago wherein patient had pain in the left wrist area with swelling which worsened to its distal aspect involving his left small finger and over the last 24-48 hours it started moving proximally towards forearm. Patient also had cold sweats chills and fever last night. Patient is unable to make a fist of his hand and has severe pain. X-ray shows diffuse soft tissue swelling. Patient denies any animal bite insect bite and denies any trauma. Patient has been admitted for further management.  Past Medical History  Diagnosis Date  . Hypertension   . Myasthenia gravis     Past Surgical History  Procedure Date  . Eye surgery   . Abdominal surgery   . Cholecystectomy     Family History  Problem Relation Age of Onset  . Heart failure Father    Social History:  reports that he has quit smoking. He does not have any smokeless tobacco history on file. He reports that he does not drink alcohol or use illicit drugs.  Allergies: No Known Allergies   (Not in a hospital admission)  Results for orders placed during the hospital encounter of 12/13/11 (from the past 48 hour(s))  CBC     Status: Abnormal   Collection Time   12/13/11  3:50 AM      Component Value Range Comment   WBC 14.2 (*) 4.0 - 10.5 K/uL    RBC 4.66  4.22 - 5.81 MIL/uL    Hemoglobin 15.2  13.0 - 17.0 g/dL    HCT 82.9  56.2 - 13.0 %    MCV 93.6  78.0 - 100.0 fL    MCH 32.6  26.0 - 34.0 pg    MCHC 34.9  30.0 - 36.0 g/dL    RDW 86.5  78.4 - 69.6 %    Platelets 322  150 - 400 K/uL   DIFFERENTIAL     Status: Abnormal   Collection Time   12/13/11  3:50 AM      Component Value Range Comment   Neutrophils Relative 65  43 - 77 %    Neutro Abs  9.2 (*) 1.7 - 7.7 K/uL    Lymphocytes Relative 25  12 - 46 %    Lymphs Abs 3.6  0.7 - 4.0 K/uL    Monocytes Relative 9  3 - 12 %    Monocytes Absolute 1.3 (*) 0.1 - 1.0 K/uL    Eosinophils Relative 0  0 - 5 %    Eosinophils Absolute 0.0  0.0 - 0.7 K/uL    Basophils Relative 0  0 - 1 %    Basophils Absolute 0.0  0.0 - 0.1 K/uL   POCT I-STAT, CHEM 8     Status: Abnormal   Collection Time   12/13/11  3:52 AM      Component Value Range Comment   Sodium 142  135 - 145 mEq/L    Potassium 3.5  3.5 - 5.1 mEq/L    Chloride 102  96 - 112 mEq/L    BUN 11  6 - 23 mg/dL    Creatinine, Ser 2.95  0.50 - 1.35 mg/dL    Glucose, Bld 284 (*) 70 - 99 mg/dL  Calcium, Ion 1.11 (*) 1.12 - 1.32 mmol/L    TCO2 27  0 - 100 mmol/L    Hemoglobin 15.3  13.0 - 17.0 g/dL    HCT 16.1  09.6 - 04.5 %    Dg Forearm Left  12/13/2011  *RADIOLOGY REPORT*  Clinical Data: Left hand pain and swelling.  LEFT FOREARM - 2 VIEW  Comparison: None.  Findings: Note that a forearm radiograph does not exclude hand or wrist pathology.  Mild radiocarpal DJD.  No displaced fracture of the forearm or aggressive osseous lesion identified.  There is probable soft tissue edema.  IMPRESSION: Soft tissue swelling without displaced forearm fracture identified.  Original Report Authenticated By: Waneta Martins, M.D.    Review of Systems  Constitutional: Positive for fever and chills.  HENT: Negative.   Eyes: Negative.   Respiratory: Negative.   Cardiovascular: Negative.   Gastrointestinal: Negative.   Genitourinary: Negative.   Musculoskeletal:       Pain left hand with swelling.  Skin: Negative.   Neurological: Negative.   Endo/Heme/Allergies: Negative.   Psychiatric/Behavioral: Negative.     Blood pressure 156/73, pulse 68, temperature 99.1 F (37.3 C), temperature source Oral, resp. rate 18, SpO2 95.00%. Physical Exam  Constitutional: He is oriented to person, place, and time. He appears well-developed and well-nourished.  No distress.  HENT:  Head: Normocephalic and atraumatic.  Right Ear: External ear normal.  Left Ear: External ear normal.  Nose: Nose normal.  Mouth/Throat: Oropharynx is clear and moist. No oropharyngeal exudate.  Eyes: Conjunctivae are normal. Pupils are equal, round, and reactive to light. Right eye exhibits no discharge. Left eye exhibits no discharge.  Neck: Normal range of motion. Neck supple.  Cardiovascular: Normal rate and regular rhythm.   Respiratory: Effort normal and breath sounds normal. No respiratory distress. He has no wheezes. He has no rales.  GI: Soft. Bowel sounds are normal. He exhibits no distension. There is no tenderness. There is no rebound.  Musculoskeletal:       Left hand and forearm is swollen and unable to flex his fingers.  Neurological: He is alert and oriented to person, place, and time.       Moves all extremities.  Skin: Skin is warm and dry. He is not diaphoretic.  Psychiatric: His behavior is normal.     Assessment/Plan #1. Cellulitis involving the left hand and left forearm - at this time I have discussed with Dr. Orlan Leavens, on-call hand surgeon. Dr. Orlan Leavens will be seeing patient in consult. We will keep patient n.p.o. until seen by surgery in anticipation of possible procedure. Keep left hand elevated. Currently antibiotics including vancomycin Zosyn and we'll add clindamycin. Patient states he had tetanus vaccination with the last 5 years.  #2. 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 At this time I will give one dose of IV hydrocortisone. Continue oral prednisone. #3. Hypertension - continue present medications. #4. Chronic leukocytosis.  CODE STATUS - full code.  Eduard Clos 12/13/2011, 6:21 AM

## 2011-12-13 NOTE — ED Notes (Signed)
Pt c/o left hand pain onset 2 weeks.  Began at left wrist, moved to left 5th finger and now across hand and up to mid forearm.  Denies injury, insect bites . Left arm, swollen, red,warm to touch.  Palpable radial pulse but painful to touch.    Rates pain 10/10, unable to move fingers due to pain and swelling.  States last DT within last 5 years.

## 2011-12-13 NOTE — ED Provider Notes (Signed)
Medical screening examination/treatment/procedure(s) were conducted as a shared visit with non-physician practitioner(s) and myself.  I personally evaluated the patient during the encounter Seen and personally examined RRR CTAB NABS LUE red and warm and swollen from hand to the elbow.  Cap refill to the fingers of the hand < 2 sec, hand neurovascularly intact Ao3 gait normal Thought content normal  Plan labs imaging and admit to IV abx  Kaede Clendenen K Benard Minturn-Rasch, MD 12/13/11 [phone]

## 2011-12-13 NOTE — Progress Notes (Signed)
[facility] PROGRESS NOTE  [name] AVW:[id number] DOB: 02-17-1932 DOA: 12/13/2011   Assessment/Plan: Patient Active Hospital Problem List: Cellulitis of hand (12/13/2011) -he just came up from the ED has gotten 1 dose of Vanco and Zosyn. He relates the arm swelling is better. Agree with plans as above. And surgeon consult pending.   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  HTN (hypertension) (12/13/2011) -slightly high today, question secondary to pain. We'll continue to monitor. -Continue current home medications.  Code Status: Full code Family Communication: Spouse [phone] Disposition Plan: Home  Lambert Keto, MD  [facility] Pager [phone]  If 7PM-7AM, please contact night-coverage [email or web address] Password [email or web address] 12/13/2011, 8:40 AM   LOS: 0 days   Procedures:  None  Antibiotics:  Vancomycin 12/13/2011>>  Zosyn 12/13/2011>>  Clindamycin  12/13/2011>>  Interim History:   Subjective: Relates his hand feels better.  Objective: Filed Vitals:   12/13/11 0259 12/13/11 0537 12/13/11 0726  BP: 152/78 156/73 158/70  Pulse: 87 68 73  Temp: 98.2 F (36.8 C) 99.1 F (37.3 C) 97.9 F (36.6 C)  TempSrc: Oral Oral Oral  Resp: 18 18 18   Height:   5\' 11"  (1.803 m)  Weight:   93.35 kg (205 lb 12.8 oz)  SpO2: 98% 95% 91%   No intake or output data in the 24 hours ending 12/13/11 0840 Weight change:   Exam:  General: Alert, awake, oriented x3, in no acute distress.  HEENT: No bruits, no goiter.  Heart: Regular rate and rhythm, without murmurs, rubs, gallops.  Lungs: Good air movement, bilateral air movement.  Abdomen: Soft, nontender, nondistended, positive bowel sounds.  Neuro: Grossly intact, nonfocal. Extremities: Swelling and redness of the left arm up to the elbow.  Data Reviewed: Basic Metabolic Panel:  Lab 12/13/11 6295  NA 142  K 3.5  CL 102  CO2 --  GLUCOSE 122*  BUN 11  CREATININE  1.10  CALCIUM --  MG --  PHOS --   Liver Function Tests: No results found for this basename: AST:5,ALT:5,ALKPHOS:5,BILITOT:5,PROT:5,ALBUMIN:5 in the last 168 hours No results found for this basename: LIPASE:5,AMYLASE:5 in the last 168 hours No results found for this basename: AMMONIA:5 in the last 168 hours CBC:  Lab 12/13/11 0352 12/13/11 0350  WBC -- 14.2*  NEUTROABS -- 9.2*  HGB 15.3 15.2  HCT 45.0 43.6  MCV -- 93.6  PLT -- 322   Cardiac Enzymes: No results found for this basename: CKTOTAL:5,CKMB:5,CKMBINDEX:5,TROPONINI:5 in the last 168 hours BNP: No components found with this basename: POCBNP:5 CBG:  Lab 12/13/11 0742  GLUCAP 154*    No results found for this or any previous visit (from the past 240 hour(s)).   Studies: Dg Forearm Left  12/13/2011  *RADIOLOGY REPORT*  Clinical Data: Left hand pain and swelling.  LEFT FOREARM - 2 VIEW  Comparison: None.  Findings: Note that a forearm radiograph does not exclude hand or wrist pathology.  Mild radiocarpal DJD.  No displaced fracture of the forearm or aggressive osseous lesion identified.  There is probable soft tissue edema.  IMPRESSION: Soft tissue swelling without displaced forearm fracture identified.  Original Report Authenticated By: Waneta Martins, M.D.    Scheduled Meds:   . amLODipine  5 mg Oral Daily  . brimonidine  1 drop Left Eye BID  . clindamycin (CLEOCIN) IV  600 mg Intravenous Q8H  . hydrocortisone sodium succinate      . hydrocortisone sodium succinate  50 mg Intrathecal Once  .  HYDROmorphone (DILAUDID) injection  1 mg Intramuscular Once  . irbesartan  150 mg Oral QHS  . nepafenac  3 drop Left Eye TID  . ondansetron (ZOFRAN) IV  4 mg Intravenous Once  . piperacillin-tazobactam (ZOSYN)  IV  3.375 g Intravenous Once  . piperacillin-tazobactam (ZOSYN)  IV  3.375 g Intravenous Q8H  . prednisoLONE acetate  1 drop Left Eye QID  . predniSONE  10 mg Oral Q breakfast  . timolol  1 drop Left Eye BID  .  vancomycin  1,000 mg Intravenous Once  . vancomycin  1,000 mg Intravenous Q12H  . DISCONTD: brimonidine-timolol  1 drop Left Eye Q12H   Continuous Infusions:   . sodium chloride

## 2011-12-13 NOTE — ED Provider Notes (Signed)
History     CSN: [id number]  Arrival date & time 12/13/11  4098   First MD Initiated Contact with Patient 12/13/11 0309      Chief Complaint  Patient presents with  . Hand Pain    (Consider location/radiation/quality/duration/timing/severity/associated sxs/prior treatment) HPI Comments: Patient here with two week history of left forearm pain and swelling - states that the pain began gradually starting in the ulnar region of the wrist and has radiated both down into the hand and up the forearm excluding the elbow.  He reports redness, swelling and pain in the joints.  Decrease in flexion of the fingers and wrist due to the pain.  Full ROM to the elbow.  Denies any injury to the arm, notes a remote history of dog bite to the dorsum of the left hand 3 years ago.  No history of gout or other arthropathies.  Patient is a 78 y.o. male presenting with hand pain. The history is provided by the patient. No language interpreter was used.  Hand Pain This is a new problem. The current episode started 1 to 4 weeks ago. The problem occurs constantly. The problem has been gradually worsening. Associated symptoms include arthralgias, joint swelling and myalgias. Pertinent negatives include no abdominal pain, anorexia, change in bowel habit, chest pain, chills, congestion, coughing, diaphoresis, fatigue, fever, headaches, nausea, neck pain, numbness, rash, sore throat, swollen glands, urinary symptoms, vertigo, visual change, vomiting or weakness. The symptoms are aggravated by bending. He has tried nothing for the symptoms. The treatment provided no relief.    Past Medical History  Diagnosis Date  . Hypertension   . Myasthenia gravis     Past Surgical History  Procedure Date  . Eye surgery   . Abdominal surgery     No family history on file.  History  Substance Use Topics  . Smoking status: Former Games developer  . Smokeless tobacco: Not on file  . Alcohol Use: No      Review of Systems    Constitutional: Negative for fever, chills, diaphoresis and fatigue.  HENT: Negative for congestion, sore throat and neck pain.   Respiratory: Negative for cough.   Cardiovascular: Negative for chest pain.  Gastrointestinal: Negative for nausea, vomiting, abdominal pain, anorexia and change in bowel habit.  Musculoskeletal: Positive for myalgias, joint swelling and arthralgias.  Skin: Negative for rash.  Neurological: Negative for vertigo, weakness, numbness and headaches.  All other systems reviewed and are negative.    Allergies  Review of patient's allergies indicates no known allergies.  Home Medications   Current Outpatient Rx  Name Route Sig Dispense Refill  . AMLODIPINE BESYLATE 5 MG PO TABS Oral Take 5 mg by mouth daily.    Marland Kitchen BRIMONIDINE TARTRATE-TIMOLOL 0.2-0.5 % OP SOLN Left Eye Place 1 drop into the left eye every 12 (twelve) hours.    . FUROSEMIDE 20 MG PO TABS Oral Take 20 mg by mouth daily.    . IRBESARTAN 150 MG PO TABS Oral Take 150 mg by mouth at bedtime.    Marland Kitchen NEPAFENAC 0.1 % OP SUSP Left Eye Place 3 drops into the left eye 3 (three) times daily.    Marland Kitchen PREDNISOLONE ACETATE 1 % OP SUSP Left Eye Place 1 drop into the left eye 4 (four) times daily.    Marland Kitchen PREDNISONE 10 MG PO TABS Oral Take 10 mg by mouth daily.      BP 152/78  Pulse 87  Temp 98.2 F (36.8 C) (Oral)  Resp 18  SpO2 98%  Physical Exam  Nursing note and vitals reviewed. Constitutional: He is oriented to person, place, and time. He appears well-developed and well-nourished.       Uncomfortable appearing  HENT:  Head: Normocephalic and atraumatic.  Right Ear: External ear normal.  Left Ear: External ear normal.  Nose: Nose normal.  Mouth/Throat: Oropharynx is clear and moist. No oropharyngeal exudate.  Eyes: Conjunctivae are normal. Pupils are equal, round, and reactive to light. No scleral icterus.  Neck: Normal range of motion. Neck supple.  Cardiovascular: Normal rate, regular rhythm and  normal heart sounds.  Exam reveals no gallop and no friction rub.   No murmur heard. Pulmonary/Chest: Effort normal and breath sounds normal. No respiratory distress. He has no wheezes. He has no rales. He exhibits no tenderness.  Abdominal: Soft. Bowel sounds are normal. He exhibits no distension. There is no tenderness.  Musculoskeletal:       Left elbow: He exhibits normal range of motion, no swelling and no effusion. no tenderness found.       Left wrist: He exhibits decreased range of motion, tenderness, bony tenderness and swelling. He exhibits no effusion and no laceration.       Left forearm: He exhibits tenderness and swelling. He exhibits no bony tenderness.       Left hand: He exhibits decreased range of motion, tenderness and swelling. He exhibits no bony tenderness and normal capillary refill. normal sensation noted. Decreased strength noted. He exhibits thumb/finger opposition and wrist extension trouble.  Lymphadenopathy:    He has no cervical adenopathy.  Neurological: He is alert and oriented to person, place, and time. No cranial nerve deficit.  Skin: Skin is warm and dry. No rash noted. There is erythema. No pallor.       Thin skin with erythema from mid hand to just distal to the elbow.    ED Course  Procedures (including critical care time)  Labs Reviewed  CBC - Abnormal; Notable for the following:    WBC 14.2 (*)     All other components within normal limits  DIFFERENTIAL - Abnormal; Notable for the following:    Neutro Abs 9.2 (*)     Monocytes Absolute 1.3 (*)     All other components within normal limits  POCT I-STAT, CHEM 8 - Abnormal; Notable for the following:    Glucose, Bld 122 (*)     Calcium, Ion 1.11 (*)     All other components within normal limits   No results found.  Results for orders placed during the hospital encounter of 12/13/11  CBC      Component Value Range   WBC 14.2 (*) 4.0 - 10.5 K/uL   RBC 4.66  4.22 - 5.81 MIL/uL   Hemoglobin  15.2  13.0 - 17.0 g/dL   HCT 29.5  62.1 - 30.8 %   MCV 93.6  78.0 - 100.0 fL   MCH 32.6  26.0 - 34.0 pg   MCHC 34.9  30.0 - 36.0 g/dL   RDW 65.7  84.6 - 96.2 %   Platelets 322  150 - 400 K/uL  DIFFERENTIAL      Component Value Range   Neutrophils Relative 65  43 - 77 %   Neutro Abs 9.2 (*) 1.7 - 7.7 K/uL   Lymphocytes Relative 25  12 - 46 %   Lymphs Abs 3.6  0.7 - 4.0 K/uL   Monocytes Relative 9  3 - 12 %   Monocytes Absolute 1.3 (*)  0.1 - 1.0 K/uL   Eosinophils Relative 0  0 - 5 %   Eosinophils Absolute 0.0  0.0 - 0.7 K/uL   Basophils Relative 0  0 - 1 %   Basophils Absolute 0.0  0.0 - 0.1 K/uL  POCT I-STAT, CHEM 8      Component Value Range   Sodium 142  135 - 145 mEq/L   Potassium 3.5  3.5 - 5.1 mEq/L   Chloride 102  96 - 112 mEq/L   BUN 11  6 - 23 mg/dL   Creatinine, Ser 1.61  0.50 - 1.35 mg/dL   Glucose, Bld 096 (*) 70 - 99 mg/dL   Calcium, Ion 0.45 (*) 1.12 - 1.32 mmol/L   TCO2 27  0 - 100 mmol/L   Hemoglobin 15.3  13.0 - 17.0 g/dL   HCT 40.9  81.1 - 91.4 %   Dg Forearm Left  12/13/2011  *RADIOLOGY REPORT*  Clinical Data: Left hand pain and swelling.  LEFT FOREARM - 2 VIEW  Comparison: None.  Findings: Note that a forearm radiograph does not exclude hand or wrist pathology.  Mild radiocarpal DJD.  No displaced fracture of the forearm or aggressive osseous lesion identified.  There is probable soft tissue edema.  IMPRESSION: Soft tissue swelling without displaced forearm fracture identified.  Original Report Authenticated By: Waneta Martins, M.D.     Cellulitis   MDM  Patient here with erythema and edema to left hand, wrist and forearm c/w cellulitis.  He is afebrile here and denies any injury to the arm, compartments are soft and he does have intact flexion and extension but with pain.  Started on vanc and zosyn and will be admitted to [facility]. Dr. Toniann Fail.        Izola Price [place], [place] 12/13/11 [phone]

## 2011-12-13 NOTE — ED Notes (Signed)
C/o L hand pain and swelling x 2 weeks.  No known injury.

## 2011-12-13 NOTE — Progress Notes (Signed)
ANTIBIOTIC CONSULT NOTE - INITIAL  Pharmacy Consult for Vancocin/Zosyn Indication: cellulitis  No Known Allergies  Patient Measurements: Height: 5\' 11"  (180.3 cm) Weight: 205 lb 12.8 oz (93.35 kg) IBW/kg (Calculated) : 75.3   Vital Signs: Temp: 97.9 F (36.6 C) (06/13 0726) Temp src: Oral (06/13 0726) BP: 158/70 mmHg (06/13 0726) Pulse Rate: 73  (06/13 0726)  Labs:  Basename 12/13/11 0352 12/13/11 0350  WBC -- 14.2*  HGB 15.3 15.2  PLT -- 322  LABCREA -- --  CREATININE 1.10 --   Estimated Creatinine Clearance: 64.6 ml/min (by C-G formula based on Cr of 1.1).  Microbiology: No results found for this or any previous visit (from the past 720 hour(s)).  Medical History: Past Medical History  Diagnosis Date  . Hypertension   . Myasthenia gravis     Medications:  Prescriptions prior to admission  Medication Sig Dispense Refill  . amLODipine (NORVASC) 5 MG tablet Take 5 mg by mouth daily.      . brimonidine-timolol (COMBIGAN) 0.2-0.5 % ophthalmic solution Place 1 drop into the left eye every 12 (twelve) hours.      . furosemide (LASIX) 20 MG tablet Take 20 mg by mouth daily.      . irbesartan (AVAPRO) 150 MG tablet Take 150 mg by mouth at bedtime.      . nepafenac (NEVANAC) 0.1 % ophthalmic suspension Place 3 drops into the left eye 3 (three) times daily.      . prednisoLONE acetate (PRED FORTE) 1 % ophthalmic suspension Place 1 drop into the left eye 4 (four) times daily.      . predniSONE (DELTASONE) 10 MG tablet Take 10 mg by mouth daily.       Scheduled:    . amLODipine  5 mg Oral Daily  . brimonidine-timolol  1 drop Left Eye Q12H  . clindamycin (CLEOCIN) IV  600 mg Intravenous Q8H  . hydrocortisone sodium succinate      . hydrocortisone sodium succinate  50 mg Intrathecal Once  .  HYDROmorphone (DILAUDID) injection  1 mg Intramuscular Once  . irbesartan  150 mg Oral QHS  . nepafenac  3 drop Left Eye TID  . ondansetron (ZOFRAN) IV  4 mg Intravenous Once  .  piperacillin-tazobactam (ZOSYN)  IV  3.375 g Intravenous Once  . piperacillin-tazobactam (ZOSYN)  IV  3.375 g Intravenous Q8H  . prednisoLONE acetate  1 drop Left Eye QID  . predniSONE  10 mg Oral Daily  . vancomycin  1,000 mg Intravenous Once  . vancomycin  1,000 mg Intravenous Q12H    Assessment: 78yo male c/o left hand pain and swelling, ortho consulted who feels that surgery is not needed, to begin IV ABX for possible cellulitis; DDx inflammation, plan to transition to PO if IV ABX cause improvement.  Goal of Therapy:  Vancomycin trough level 10-15 mcg/ml  Plan:  Will begin vancomycin 1000mg  IV Q12H and Zosyn 3.375g IV Q12H and monitor CBC, Cx, levels prn.  Colleen Can PharmD BCPS 12/13/2011,7:32 AM

## 2011-12-13 NOTE — Consult Note (Signed)
PT SEEN/EXAMINED LEFT HAND SWOLLEN DOES NOT HAVE FOCAL ABSCESS OR FLUID COLLECTION APPEARS MORE INFLAMMATORY THAN INFECTIOUS DO NOT RECOMMEND SURGERY ICE/ELEVATE CONSIDER GOUT/INFLAMMATORY CONDITION  IF RESPONDS TO IV ABX THAN CELLULITIS COULD BE TREATED WITH IV AND TRANSITION TO PO IMMOBILIZE WRIST SUPPORT NEEDED FULL CONSULT DICTATED I WILL BE OUT OF TOWN REMAINDER OF WEEKEND IF WORSENS WILL NEED TO CONTACT HAND SURGEON ON CALL OK TO EAT

## 2011-12-14 DIAGNOSIS — G7000 Myasthenia gravis without (acute) exacerbation: Secondary | ICD-10-CM

## 2011-12-14 DIAGNOSIS — L02519 Cutaneous abscess of unspecified hand: Secondary | ICD-10-CM

## 2011-12-14 DIAGNOSIS — I10 Essential (primary) hypertension: Secondary | ICD-10-CM

## 2011-12-14 DIAGNOSIS — L03119 Cellulitis of unspecified part of limb: Secondary | ICD-10-CM

## 2011-12-14 LAB — GLUCOSE, CAPILLARY

## 2011-12-14 MED ORDER — SULFAMETHOXAZOLE-TMP DS 800-160 MG PO TABS
1.0000 | ORAL_TABLET | Freq: Two times a day (BID) | ORAL | Status: DC
  Administered 2011-12-14 – 2011-12-15 (×3): 1 via ORAL
  Filled 2011-12-14 (×4): qty 1

## 2011-12-14 NOTE — Progress Notes (Addendum)
Inpatient Diabetes Program Recommendations  AACE/ADA: New Consensus Statement on Inpatient Glycemic Control (2009)  Target Ranges:  Prepandial:   less than 140 mg/dL      Peak postprandial:   less than 180 mg/dL (1-2 hours)      Critically ill patients:  140 - 180 mg/dL   Reason for Visit: Results for NORMON, PETTIJOHN (MRN [id number]) as of 12/14/2011 11:28  Ref. Range 12/13/2011 12:10 12/13/2011 16:53 12/13/2011 21:33 12/14/2011 08:00  Glucose-Capillary Latest Range: 70-99 mg/dL 284 (H) 132 (H) 440 (H) 158 (H)    Inpatient Diabetes Program Recommendations Correction (SSI): Please add Novolog moderate tid wc and HS. HgbA1C: Please check A1C to determine prehospitalization glycemic control  Note: No history of diabetes noted and no diabetes medications listed on medication reconciliation.  Will follow.

## 2011-12-14 NOTE — Progress Notes (Signed)
[facility] PROGRESS NOTE  [name] FAO:[id number] DOB: [birth year] DOA: 12/13/2011   Assessment/Plan: Patient Active Hospital Problem List: Cellulitis of hand (12/13/2011) -redness is significantly decreased. Patient is able to move his arm without any difficulty. Go ahead and changes antibiotics to Bactrim. -And surgery saw the patient recommended no further intervention.   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  HTN (hypertension) (12/13/2011) -at goal -Continue current home medications.  Code Status: Full code Family Communication: Spouse [phone] Disposition Plan: Home  Lambert Keto, MD  [facility] Pager [phone]  If 7PM-7AM, please contact night-coverage [email or web address] Password [email or web address] 12/14/2011, 9:51 AM   LOS: 1 day   Procedures:  None  Antibiotics:  Vancomycin 12/13/2011>> 12/14/2011  Zosyn 12/13/2011>> 12/14/2011  Clindamycin  12/13/2011>> 12/14/2011  Bactrim>> 12/14/2011  Interim History:   Subjective: Relates his head and arm are at baseline.  Objective: Filed Vitals:   12/13/11 0726 12/13/11 1324 12/13/11 2138 12/14/11 0442  BP: 158/70 151/73 131/67 141/71  Pulse: 73 80 67 64  Temp: 97.9 F (36.6 C) 97.4 F (36.3 C) 97.4 F (36.3 C) 97.7 F (36.5 C)  TempSrc: Oral Oral Oral Oral  Resp: 18 20 20 20   Height: 5\' 11"  (1.803 m)     Weight: 93.35 kg (205 lb 12.8 oz)     SpO2: 91% 94% 94% 92%    Intake/Output Summary (Last 24 hours) at 12/14/11 0951 Last data filed at 12/14/11 0900  Gross per 24 hour  Intake 3351.25 ml  Output    453 ml  Net 2898.25 ml   Weight change:   Exam:  General: Alert, awake, oriented x3, in no acute distress.  HEENT: No bruits, no goiter.  Heart: Regular rate and rhythm, without murmurs, rubs, gallops.  Lungs: Good air movement, bilateral air movement.  Abdomen: Soft, nontender, nondistended, positive bowel sounds.  Neuro: Grossly intact,  nonfocal. Extremities: No edema, some erythema but not tender to palpation her examination.  Data Reviewed: Basic Metabolic Panel:  Lab 12/13/11 2725 12/13/11 0352  NA 140 142  K 3.6 3.5  CL 102 102  CO2 27 --  GLUCOSE 167* 122*  BUN 12 11  CREATININE 1.09 1.10  CALCIUM 8.4 --  MG -- --  PHOS -- --   Liver Function Tests:  Lab 12/13/11 0850  AST 211*  ALT 104*  ALKPHOS 64  BILITOT 2.2*  PROT 5.8*  ALBUMIN 3.2*   No results found for this basename: LIPASE:5,AMYLASE:5 in the last 168 hours No results found for this basename: AMMONIA:5 in the last 168 hours CBC:  Lab 12/13/11 0850 12/13/11 0352 12/13/11 0350  WBC 13.4* -- 14.2*  NEUTROABS 10.1* -- 9.2*  HGB 14.1 15.3 15.2  HCT 42.2 45.0 43.6  MCV 94.4 -- 93.6  PLT 289 -- 322   Cardiac Enzymes: No results found for this basename: CKTOTAL:5,CKMB:5,CKMBINDEX:5,TROPONINI:5 in the last 168 hours BNP: No components found with this basename: POCBNP:5 CBG:  Lab 12/14/11 0800 12/13/11 2133 12/13/11 1653 12/13/11 1210 12/13/11 0742  GLUCAP 158* 157* 247* 242* 154*    Recent Results (from the past 240 hour(s))  CULTURE, BLOOD (ROUTINE X 2)     Status: Normal (Preliminary result)   Collection Time   12/13/11  6:30 AM      Component Value Range Status Comment   Specimen Description BLOOD RIGHT ARM   Final    Special Requests     Final    Value: BOTTLES DRAWN AEROBIC AND ANAEROBIC 10CC PATIENT  ON FOLLOWING VANCOMYCIN,ZOSYN   Culture  Setup Time [id number]   Final    Culture     Final    Value:        BLOOD CULTURE RECEIVED NO GROWTH TO DATE CULTURE WILL BE HELD FOR 5 DAYS BEFORE ISSUING A FINAL NEGATIVE REPORT   Report Status PENDING   Incomplete   CULTURE, BLOOD (ROUTINE X 2)     Status: Normal (Preliminary result)   Collection Time   12/13/11  6:40 AM      Component Value Range Status Comment   Specimen Description BLOOD LEFT ARM   Final    Special Requests     Final    Value: BOTTLES DRAWN AEROBIC ONLY 10CC  PATIENT ON FOLLOWING VANCOMYCIN,ZOSYN   Culture  Setup Time [id number]   Final    Culture     Final    Value:        BLOOD CULTURE RECEIVED NO GROWTH TO DATE CULTURE WILL BE HELD FOR 5 DAYS BEFORE ISSUING A FINAL NEGATIVE REPORT   Report Status PENDING   Incomplete      Studies: Dg Forearm Left  12/13/2011  *RADIOLOGY REPORT*  Clinical Data: Left hand pain and swelling.  LEFT FOREARM - 2 VIEW  Comparison: None.  Findings: Note that a forearm radiograph does not exclude hand or wrist pathology.  Mild radiocarpal DJD.  No displaced fracture of the forearm or aggressive osseous lesion identified.  There is probable soft tissue edema.  IMPRESSION: Soft tissue swelling without displaced forearm fracture identified.  Original Report Authenticated By: Waneta Martins, M.D.    Scheduled Meds:    . amLODipine  5 mg Oral Daily  . brimonidine  1 drop Left Eye BID  . clindamycin (CLEOCIN) IV  600 mg Intravenous Q8H  . furosemide  20 mg Oral Daily  . hydrocortisone sodium succinate      . irbesartan  150 mg Oral QHS  . nepafenac  3 drop Left Eye TID  . piperacillin-tazobactam (ZOSYN)  IV  3.375 g Intravenous Q8H  . prednisoLONE acetate  1 drop Left Eye QID  . predniSONE  10 mg Oral Q breakfast  . timolol  1 drop Left Eye BID  . vancomycin  1,000 mg Intravenous Q12H   Continuous Infusions:    . sodium chloride 20 mL/hr (12/14/11 0725)

## 2011-12-15 DIAGNOSIS — L02519 Cutaneous abscess of unspecified hand: Secondary | ICD-10-CM

## 2011-12-15 DIAGNOSIS — G7000 Myasthenia gravis without (acute) exacerbation: Secondary | ICD-10-CM

## 2011-12-15 DIAGNOSIS — I10 Essential (primary) hypertension: Secondary | ICD-10-CM

## 2011-12-15 DIAGNOSIS — L03119 Cellulitis of unspecified part of limb: Secondary | ICD-10-CM

## 2011-12-15 MED ORDER — SULFAMETHOXAZOLE-TMP DS 800-160 MG PO TABS: 1.0000 | ORAL_TABLET | Freq: Two times a day (BID) | ORAL | Status: AC

## 2011-12-15 NOTE — Discharge Planning (Signed)
Patient discharged home in stable condition. Verbalizes understanding of all discharge instructions, including home medications and follow up appointments. 

## 2011-12-15 NOTE — Discharge Summary (Signed)
Physician Discharge Summary  [name] ZOX:[id number] DOB: 29-Dec-1931 DOA: 12/13/2011  PCP: Josue Hector, MD  Admit date: 12/13/2011 Discharge date: 12/15/2011  Discharge Diagnoses:  Principal Problem:  *Cellulitis of hand Active Problems:  Ocular Myasthenia  HTN (hypertension)   Discharge Condition: Stable at the time of discharge  Disposition:  Follow-up Information    Follow up with Josue Hector, MD in 2 weeks. [facility] followup)    Contact information:   [address] [place] [address] [place] [address] [phone]          Diet: Regular diet  History of present illness:  78 year-old male with history of hypertension and ocular myasthenia gravis who is on chronic steroids presented the ER because of worsening pain in the left hand. It started off 2 weeks ago wherein patient had pain in the left wrist area with swelling which worsened to its distal aspect involving his left small finger and over the last 24-48 hours it started moving proximally towards forearm. Patient also had cold sweats chills and fever last night. Patient is unable to make a fist of his hand and has severe pain. X-ray shows diffuse soft tissue swelling. Patient denies any animal bite insect bite and denies any trauma. Patient has been admitted for further management.    Hospital Course:  Principal Problem: Cellulitis of hand: Using this to the hospital start her on empiric therapy with vancomycin and Zosyn and clindamycin. And surgeon was consulted he recommended no further intervention at this time to continue antibiotic coverage. His Avandia and Zosyn were changed to Bactrim he was watched overnight and he developed no fever some use in home a 10 more days of antibiotic. He'll follow up with his primary care Dr. next week.   Ocular Myasthenia -Stable no change.  HTN (hypertension) Controlled no changes were made.  Discharge Exam: Filed Vitals:   12/15/11 0903  BP:     Pulse: 72  Temp:   Resp:    Filed Vitals:   12/14/11 1402 12/14/11 2149 12/15/11 0530 12/15/11 0903  BP: 148/79 162/76 129/70   Pulse: 69 62 58 72  Temp: 97.3 F (36.3 C) 97.6 F (36.4 C) 97.5 F (36.4 C)   TempSrc: Oral Oral Oral   Resp: 20 20 20    Height:      Weight:      SpO2: 94% 96% 94%    General: Alert, awake, oriented x3, in no acute distress.  HEENT: No bruits, no goiter.  Heart: Regular rate and rhythm, without murmurs, rubs, gallops.  Lungs: Good air movement, bilateral air movement.  Extremities: No edema, some erythema but not tender to palpation her examination. There is redness which is probably secondary to bruising.   Discharge Instructions  Discharge Orders    Future Appointments: Provider: Department: Dept Phone: Center:   04/07/2012 8:15 AM Sherrie George, MD [facility] [phone] None     Future Orders Please Complete By Expires   Diet - low sodium heart healthy      Increase activity slowly        Medication List  As of 12/15/2011  9:32 AM   TAKE these medications         amLODipine 5 MG tablet   Commonly known as: NORVASC   Take 5 mg by mouth daily.      COMBIGAN 0.2-0.5 % ophthalmic solution   Generic drug: brimonidine-timolol   Place 1 drop into the left eye every 12 (twelve) hours.  furosemide 20 MG tablet   Commonly known as: LASIX   Take 20 mg by mouth daily.      irbesartan 150 MG tablet   Commonly known as: AVAPRO   Take 150 mg by mouth at bedtime.      nepafenac 0.1 % ophthalmic suspension   Commonly known as: NEVANAC   Place 3 drops into the left eye 3 (three) times daily.      prednisoLONE acetate 1 % ophthalmic suspension   Commonly known as: PRED FORTE   Place 1 drop into the left eye 4 (four) times daily.      predniSONE 10 MG tablet   Commonly known as: DELTASONE   Take 10 mg by mouth daily.      sulfamethoxazole-trimethoprim 800-160 MG per tablet   Commonly known as: BACTRIM DS   Take 1 tablet  by mouth every 12 (twelve) hours.              The results of significant diagnostics from this hospitalization (including imaging, microbiology, ancillary and laboratory) are listed below for reference.    Significant Diagnostic Studies: Dg Forearm Left  12/13/2011  *RADIOLOGY REPORT*  Clinical Data: Left hand pain and swelling.  LEFT FOREARM - 2 VIEW  Comparison: None.  Findings: Note that a forearm radiograph does not exclude hand or wrist pathology.  Mild radiocarpal DJD.  No displaced fracture of the forearm or aggressive osseous lesion identified.  There is probable soft tissue edema.  IMPRESSION: Soft tissue swelling without displaced forearm fracture identified.  Original Report Authenticated By: Waneta Martins, M.D.    Microbiology: Recent Results (from the past 240 hour(s))  CULTURE, BLOOD (ROUTINE X 2)     Status: Normal (Preliminary result)   Collection Time   12/13/11  6:30 AM      Component Value Range Status Comment   Specimen Description BLOOD RIGHT ARM   Final    Special Requests     Final    Value: BOTTLES DRAWN AEROBIC AND ANAEROBIC 10CC PATIENT ON FOLLOWING VANCOMYCIN,ZOSYN   Culture  Setup Time [id number]   Final    Culture     Final    Value:        BLOOD CULTURE RECEIVED NO GROWTH TO DATE CULTURE WILL BE HELD FOR 5 DAYS BEFORE ISSUING A FINAL NEGATIVE REPORT   Report Status PENDING   Incomplete   CULTURE, BLOOD (ROUTINE X 2)     Status: Normal (Preliminary result)   Collection Time   12/13/11  6:40 AM      Component Value Range Status Comment   Specimen Description BLOOD LEFT ARM   Final    Special Requests     Final    Value: BOTTLES DRAWN AEROBIC ONLY 10CC PATIENT ON FOLLOWING VANCOMYCIN,ZOSYN   Culture  Setup Time [id number]   Final    Culture     Final    Value:        BLOOD CULTURE RECEIVED NO GROWTH TO DATE CULTURE WILL BE HELD FOR 5 DAYS BEFORE ISSUING A FINAL NEGATIVE REPORT   Report Status PENDING   Incomplete      Labs: Basic Metabolic  Panel:  Lab 12/13/11 0850 12/13/11 0352  NA 140 142  K 3.6 3.5  CL 102 102  CO2 27 --  GLUCOSE 167* 122*  BUN 12 11  CREATININE 1.09 1.10  CALCIUM 8.4 --  MG -- --  PHOS -- --   Liver Function Tests:  Lab 12/13/11  0850  AST 211*  ALT 104*  ALKPHOS 64  BILITOT 2.2*  PROT 5.8*  ALBUMIN 3.2*   No results found for this basename: LIPASE:5,AMYLASE:5 in the last 168 hours No results found for this basename: AMMONIA:5 in the last 168 hours CBC:  Lab 12/13/11 0850 12/13/11 0352 12/13/11 0350  WBC 13.4* -- 14.2*  NEUTROABS 10.1* -- 9.2*  HGB 14.1 15.3 15.2  HCT 42.2 45.0 43.6  MCV 94.4 -- 93.6  PLT 289 -- 322   Cardiac Enzymes: No results found for this basename: CKTOTAL:5,CKMB:5,CKMBINDEX:5,TROPONINI:5 in the last 168 hours BNP: No components found with this basename: POCBNP:5 CBG:  Lab 12/14/11 2146 12/14/11 1724 12/14/11 1212 12/14/11 0800 12/13/11 2133  GLUCAP 172* 201* 193* 158* 157*    Time coordinating discharge: Greater than 30 minutes  Signed:  Marinda Elk  [facility] 12/15/2011, 9:32 AM

## 2011-12-17 LAB — GLUCOSE, CAPILLARY: Glucose-Capillary: 124 mg/dL — ABNORMAL HIGH (ref 70–99)

## 2011-12-19 LAB — CULTURE, BLOOD (ROUTINE X 2): Culture  Setup Time: 201306131323

## 2012-01-03 NOTE — Consult Note (Signed)
NAMEMarland Kitchen  [name], [name] NO.:  [id number]  MEDICAL RECORD NO.:  [id number]  LOCATION:  5505                         FACILITY:  [facility]  PHYSICIAN:  Erik Done, MD  DATE OF BIRTH:  [birth year]  DATE OF CONSULTATION:  12/13/2011 DATE OF DISCHARGE:  12/15/2011                                CONSULTATION   REQUESTING PHYSICIAN:  Erik Clos, MD.  REASON FOR CONSULTATION:  Left hand swelling.  HISTORY OF PRESENT ILLNESS:  This is a 78 year old male with a history of hypertension and ocular myasthenia gravis, who is on chronic steroids, who presented to the ER with worsening pain and swelling in his left hand.  It started 2 weeks ago when the patient had pain in his left wrist area with swelling, which worsened to the distal aspect involving the small finger.  The patient is having fevers and chills. He is unable to make a fist on increasing pain.  The patient did have the soft tissue swelling noted and was admitted to the Medicine Service for evaluation.  I was asked to evaluate the patient for potential abscess.  Past medical history, past surgical history, medications, allergies, social history, and review of systems as documented in the Epic chart and was reviewed.  In addition to reviewing of this, the patient's serum and blood studies were reviewed as well as his radiographs, which did show the soft tissue swelling over the dorsal aspect of the hand, but without any actual fracture or bone irregularities.  On examination, he is a healthy-appearing male.  Height and weight are in the computer.  He has a normal gait and station, good hand coordination in his right hand.  Normal mood.  He is alert and oriented to person, place, and time.  On examination of left upper extremity, the patient does have the dorsal soft tissue swelling.  He does not have any evidence of fluctuance.  He does not have any abscess collection.  He does not have any  ascending erythema or lymphangitis.  He is able to make the [facility] sign, cross his fingers, extend his thumb, extend his digits.  His fingertips are warm and well perfused.  Good muscle tone and good strength in the hand wrist and digits.  He has relatively good strength in the hand.  IMPRESSION:  Left hand cellulitis, without evidence of focal abscess or fluid collection.  PLAN:  Today, the findings were reviewed with [name].  The patient does have the cellulitis, which appears more of a gouty presentation or inflammatory condition rather than infectious.  I did not recommend any type of invasive or surgical intervention.  We talked about ice, elevation, oral antibiotics, and immobilize the wrist for support as needed.  He is going to be continued followed by the Medicine Service. He needs to follow up with me as an outpatient should his symptoms are worse or persist.  All questions were answered and encouraged today.  The patient voiced understanding and the reason for the consultation.  Should any issues or concerns arise, please contact me.     Erik Done, MD     FWO/MEDQ  D:  01/03/2012  T:  01/03/2012  Job:  [id number]

## 2012-01-06 ENCOUNTER — Emergency Department (HOSPITAL_COMMUNITY)
Admission: EM | Admit: 2012-01-06 | Discharge: 2012-01-06 | Disposition: A | Discharge status: Home / Self Care | Payer: Medicare Other | Attending: Emergency Medicine | Admitting: Emergency Medicine

## 2012-01-06 ENCOUNTER — Encounter (HOSPITAL_COMMUNITY): Payer: Self-pay | Admitting: *Deleted

## 2012-01-06 DIAGNOSIS — L03119 Cellulitis of unspecified part of limb: Secondary | ICD-10-CM

## 2012-01-06 DIAGNOSIS — I10 Essential (primary) hypertension: Secondary | ICD-10-CM | POA: Insufficient documentation

## 2012-01-06 DIAGNOSIS — L02519 Cutaneous abscess of unspecified hand: Secondary | ICD-10-CM | POA: Insufficient documentation

## 2012-01-06 DIAGNOSIS — Z87891 Personal history of nicotine dependence: Secondary | ICD-10-CM | POA: Insufficient documentation

## 2012-01-06 DIAGNOSIS — G7000 Myasthenia gravis without (acute) exacerbation: Secondary | ICD-10-CM | POA: Insufficient documentation

## 2012-01-06 MED ORDER — SULFAMETHOXAZOLE-TMP DS 800-160 MG PO TABS
1.0000 | ORAL_TABLET | Freq: Once | ORAL | Status: AC
  Administered 2012-01-06: 1 via ORAL
  Filled 2012-01-06: qty 1

## 2012-01-06 MED ORDER — SULFAMETHOXAZOLE-TRIMETHOPRIM 800-160 MG PO TABS: 1.0000 | ORAL_TABLET | Freq: Two times a day (BID) | ORAL | Status: AC

## 2012-01-06 NOTE — ED Provider Notes (Signed)
History     CSN: [id number]  Arrival date & time 01/06/12  7253   First MD Initiated Contact with Patient 01/06/12 0827      Chief Complaint  Patient presents with  . Hand Problem    (Consider location/radiation/quality/duration/timing/severity/associated sxs/prior treatment) HPI Comments: Patient was admitted for hand cellulitis last month.  Completed bactrim about one week ago.  Redness now returning.  Denies injury, fevers, or chills.    The history is provided by the patient.    Past Medical History  Diagnosis Date  . Hypertension   . Myasthenia gravis   . Cellulitis     Past Surgical History  Procedure Date  . Eye surgery   . Abdominal surgery   . Cholecystectomy     Family History  Problem Relation Age of Onset  . Heart failure Father     History  Substance Use Topics  . Smoking status: Former Games developer  . Smokeless tobacco: Never Used  . Alcohol Use: No      Review of Systems  Constitutional: Negative for fever and chills.  Musculoskeletal:       As per hpi.  All other systems reviewed and are negative.    Allergies  Review of patient's allergies indicates no known allergies.  Home Medications   Current Outpatient Rx  Name Route Sig Dispense Refill  . AMLODIPINE BESYLATE 5 MG PO TABS Oral Take 5 mg by mouth daily.    Marland Kitchen BRIMONIDINE TARTRATE-TIMOLOL 0.2-0.5 % OP SOLN Left Eye Place 1 drop into the left eye every 12 (twelve) hours.    . FUROSEMIDE 20 MG PO TABS Oral Take 20 mg by mouth daily.    . IRBESARTAN 150 MG PO TABS Oral Take 150 mg by mouth at bedtime.    Marland Kitchen NEPAFENAC 0.1 % OP SUSP Left Eye Place 3 drops into the left eye 2 (two) times daily.     Marland Kitchen PREDNISOLONE ACETATE 1 % OP SUSP Left Eye Place 1 drop into the left eye daily.     Marland Kitchen PREDNISONE 10 MG PO TABS Oral Take 10 mg by mouth daily.      BP 157/79  Pulse 82  Temp 98.9 F (37.2 C) (Oral)  Resp 18  SpO2 95%  Physical Exam  Nursing note and vitals reviewed. Constitutional: He  is oriented to person, place, and time. He appears well-developed and well-nourished.  HENT:  Head: Normocephalic and atraumatic.  Neck: Normal range of motion. Neck supple.  Musculoskeletal:       There is warmth, ttp over the dorsum of the right hand.  There is free range of motion without limitation.  Neurological: He is alert and oriented to person, place, and time.  Skin: Skin is warm and dry. He is not diaphoretic.    ED Course  Procedures (including critical care time)  Labs Reviewed - No data to display No results found.   No diagnosis found.    MDM  Per patient, the hand is nowhere as bad as it was last month when he was admitted.  I reviewed his record and offered CDU cellulitis protocol which he would prefer not to do.  He wants to try a po antibiotic and will return if he worsens.          Geoffery Lyons, MD 01/06/12 [phone]

## 2012-01-06 NOTE — ED Notes (Signed)
Pt reports being tx 3 weeks ago for cellulitis to left hand, started having swelling again on Friday. Mild swelling noted at triage, no distress noted.

## 2012-01-11 ENCOUNTER — Other Ambulatory Visit: Payer: Self-pay | Admitting: Physician Assistant

## 2012-04-07 ENCOUNTER — Ambulatory Visit (INDEPENDENT_AMBULATORY_CARE_PROVIDER_SITE_OTHER): Payer: Medicare Other | Admitting: Ophthalmology

## 2012-04-07 DIAGNOSIS — H35359 Cystoid macular degeneration, unspecified eye: Secondary | ICD-10-CM

## 2012-04-07 DIAGNOSIS — H35039 Hypertensive retinopathy, unspecified eye: Secondary | ICD-10-CM

## 2012-04-07 DIAGNOSIS — H43819 Vitreous degeneration, unspecified eye: Secondary | ICD-10-CM

## 2012-04-07 DIAGNOSIS — I10 Essential (primary) hypertension: Secondary | ICD-10-CM

## 2012-04-07 DIAGNOSIS — H3530 Unspecified macular degeneration: Secondary | ICD-10-CM

## 2012-05-13 ENCOUNTER — Other Ambulatory Visit: Payer: Self-pay | Admitting: Physician Assistant

## 2012-08-08 ENCOUNTER — Ambulatory Visit (INDEPENDENT_AMBULATORY_CARE_PROVIDER_SITE_OTHER): Payer: Medicare Other | Admitting: Ophthalmology

## 2012-08-08 DIAGNOSIS — H35039 Hypertensive retinopathy, unspecified eye: Secondary | ICD-10-CM

## 2012-08-08 DIAGNOSIS — H43819 Vitreous degeneration, unspecified eye: Secondary | ICD-10-CM

## 2012-08-08 DIAGNOSIS — H35359 Cystoid macular degeneration, unspecified eye: Secondary | ICD-10-CM

## 2012-08-08 DIAGNOSIS — H401390 Pigmentary glaucoma, unspecified eye, stage unspecified: Secondary | ICD-10-CM

## 2012-08-08 DIAGNOSIS — H2700 Aphakia, unspecified eye: Secondary | ICD-10-CM

## 2012-08-08 DIAGNOSIS — I10 Essential (primary) hypertension: Secondary | ICD-10-CM

## 2013-01-23 ENCOUNTER — Other Ambulatory Visit: Payer: Self-pay | Admitting: Physician Assistant

## 2013-02-06 ENCOUNTER — Ambulatory Visit (INDEPENDENT_AMBULATORY_CARE_PROVIDER_SITE_OTHER): Payer: Medicare Other | Admitting: Ophthalmology

## 2013-02-06 DIAGNOSIS — H43819 Vitreous degeneration, unspecified eye: Secondary | ICD-10-CM

## 2013-02-06 DIAGNOSIS — I10 Essential (primary) hypertension: Secondary | ICD-10-CM

## 2013-02-06 DIAGNOSIS — H35359 Cystoid macular degeneration, unspecified eye: Secondary | ICD-10-CM

## 2013-02-06 DIAGNOSIS — H35039 Hypertensive retinopathy, unspecified eye: Secondary | ICD-10-CM

## 2013-02-06 DIAGNOSIS — H2700 Aphakia, unspecified eye: Secondary | ICD-10-CM

## 2013-02-19 ENCOUNTER — Other Ambulatory Visit: Payer: Self-pay | Admitting: Physician Assistant

## 2013-05-27 ENCOUNTER — Other Ambulatory Visit: Payer: Self-pay | Admitting: Physician Assistant

## 2014-04-16 ENCOUNTER — Other Ambulatory Visit: Payer: Self-pay | Admitting: Physician Assistant

## 2014-05-22 ENCOUNTER — Encounter (HOSPITAL_COMMUNITY): Admission: EM | Disposition: A | Discharge status: Skilled Nursing Facility | Payer: Self-pay | Source: Home / Self Care

## 2014-05-22 ENCOUNTER — Inpatient Hospital Stay (HOSPITAL_COMMUNITY): Payer: Medicare Other

## 2014-05-22 ENCOUNTER — Encounter (HOSPITAL_COMMUNITY): Payer: Self-pay | Admitting: Emergency Medicine

## 2014-05-22 ENCOUNTER — Emergency Department (HOSPITAL_COMMUNITY): Payer: Medicare Other | Admitting: Anesthesiology

## 2014-05-22 ENCOUNTER — Emergency Department (HOSPITAL_COMMUNITY): Payer: Medicare Other

## 2014-05-22 ENCOUNTER — Inpatient Hospital Stay (HOSPITAL_COMMUNITY)
Admission: EM | Admit: 2014-05-22 | Discharge: 2014-06-03 | DRG: 347 | Disposition: A | Discharge status: Skilled Nursing Facility | Payer: Medicare Other | Attending: General Surgery | Admitting: General Surgery

## 2014-05-22 DIAGNOSIS — K5700 Diverticulitis of small intestine with perforation and abscess without bleeding: Secondary | ICD-10-CM | POA: Diagnosis present

## 2014-05-22 DIAGNOSIS — K659 Peritonitis, unspecified: Secondary | ICD-10-CM | POA: Diagnosis present

## 2014-05-22 DIAGNOSIS — Z7952 Long term (current) use of systemic steroids: Secondary | ICD-10-CM | POA: Diagnosis not present

## 2014-05-22 DIAGNOSIS — D72829 Elevated white blood cell count, unspecified: Secondary | ICD-10-CM | POA: Insufficient documentation

## 2014-05-22 DIAGNOSIS — E2740 Unspecified adrenocortical insufficiency: Secondary | ICD-10-CM | POA: Diagnosis present

## 2014-05-22 DIAGNOSIS — R0602 Shortness of breath: Secondary | ICD-10-CM

## 2014-05-22 DIAGNOSIS — G7000 Myasthenia gravis without (acute) exacerbation: Secondary | ICD-10-CM | POA: Diagnosis present

## 2014-05-22 DIAGNOSIS — R739 Hyperglycemia, unspecified: Secondary | ICD-10-CM | POA: Diagnosis not present

## 2014-05-22 DIAGNOSIS — D696 Thrombocytopenia, unspecified: Secondary | ICD-10-CM | POA: Insufficient documentation

## 2014-05-22 DIAGNOSIS — Z09 Encounter for follow-up examination after completed treatment for conditions other than malignant neoplasm: Secondary | ICD-10-CM

## 2014-05-22 DIAGNOSIS — I429 Cardiomyopathy, unspecified: Secondary | ICD-10-CM | POA: Diagnosis present

## 2014-05-22 DIAGNOSIS — K4090 Unilateral inguinal hernia, without obstruction or gangrene, not specified as recurrent: Secondary | ICD-10-CM | POA: Diagnosis present

## 2014-05-22 DIAGNOSIS — I10 Essential (primary) hypertension: Secondary | ICD-10-CM | POA: Diagnosis present

## 2014-05-22 DIAGNOSIS — E441 Mild protein-calorie malnutrition: Secondary | ICD-10-CM | POA: Diagnosis present

## 2014-05-22 DIAGNOSIS — N179 Acute kidney failure, unspecified: Secondary | ICD-10-CM | POA: Diagnosis not present

## 2014-05-22 DIAGNOSIS — Z01818 Encounter for other preprocedural examination: Secondary | ICD-10-CM

## 2014-05-22 DIAGNOSIS — I5040 Unspecified combined systolic (congestive) and diastolic (congestive) heart failure: Secondary | ICD-10-CM | POA: Diagnosis present

## 2014-05-22 DIAGNOSIS — K5701 Diverticulitis of small intestine with perforation and abscess with bleeding: Secondary | ICD-10-CM | POA: Diagnosis present

## 2014-05-22 DIAGNOSIS — I480 Paroxysmal atrial fibrillation: Secondary | ICD-10-CM | POA: Diagnosis present

## 2014-05-22 DIAGNOSIS — I4819 Other persistent atrial fibrillation: Secondary | ICD-10-CM | POA: Insufficient documentation

## 2014-05-22 DIAGNOSIS — R109 Unspecified abdominal pain: Secondary | ICD-10-CM

## 2014-05-22 DIAGNOSIS — N170 Acute kidney failure with tubular necrosis: Secondary | ICD-10-CM

## 2014-05-22 DIAGNOSIS — R40244 Other coma, without documented Glasgow coma scale score, or with partial score reported: Secondary | ICD-10-CM

## 2014-05-22 DIAGNOSIS — Z87891 Personal history of nicotine dependence: Secondary | ICD-10-CM

## 2014-05-22 DIAGNOSIS — J9621 Acute and chronic respiratory failure with hypoxia: Secondary | ICD-10-CM

## 2014-05-22 DIAGNOSIS — I950 Idiopathic hypotension: Secondary | ICD-10-CM

## 2014-05-22 DIAGNOSIS — J95821 Acute postprocedural respiratory failure: Secondary | ICD-10-CM | POA: Diagnosis not present

## 2014-05-22 DIAGNOSIS — Z7901 Long term (current) use of anticoagulants: Secondary | ICD-10-CM | POA: Diagnosis not present

## 2014-05-22 DIAGNOSIS — J9600 Acute respiratory failure, unspecified whether with hypoxia or hypercapnia: Secondary | ICD-10-CM

## 2014-05-22 DIAGNOSIS — Z9049 Acquired absence of other specified parts of digestive tract: Secondary | ICD-10-CM

## 2014-05-22 DIAGNOSIS — Z4659 Encounter for fitting and adjustment of other gastrointestinal appliance and device: Secondary | ICD-10-CM

## 2014-05-22 HISTORY — PX: BOWEL RESECTION: SHX1257

## 2014-05-22 HISTORY — DX: Diverticulosis of small intestine without perforation or abscess without bleeding: K57.10

## 2014-05-22 HISTORY — PX: LAPAROTOMY: SHX154

## 2014-05-22 LAB — CBC WITH DIFFERENTIAL/PLATELET
BASOS PCT: 0 % (ref 0–1)
Basophils Absolute: 0 10*3/uL (ref 0.0–0.1)
EOS ABS: 0 10*3/uL (ref 0.0–0.7)
EOS PCT: 0 % (ref 0–5)
HCT: 46.2 % (ref 39.0–52.0)
Hemoglobin: 15.4 g/dL (ref 13.0–17.0)
Lymphocytes Relative: 26 % (ref 12–46)
Lymphs Abs: 5.9 10*3/uL — ABNORMAL HIGH (ref 0.7–4.0)
MCH: 32.2 pg (ref 26.0–34.0)
MCHC: 33.3 g/dL (ref 30.0–36.0)
MCV: 96.5 fL (ref 78.0–100.0)
MONOS PCT: 6 % (ref 3–12)
Monocytes Absolute: 1.3 10*3/uL — ABNORMAL HIGH (ref 0.1–1.0)
NEUTROS PCT: 68 % (ref 43–77)
Neutro Abs: 15.5 10*3/uL — ABNORMAL HIGH (ref 1.7–7.7)
PLATELETS: 611 10*3/uL — AB (ref 150–400)
RBC: 4.79 MIL/uL (ref 4.22–5.81)
RDW: 13.9 % (ref 11.5–15.5)
WBC: 22.7 10*3/uL — ABNORMAL HIGH (ref 4.0–10.5)

## 2014-05-22 LAB — BASIC METABOLIC PANEL
ANION GAP: 14 (ref 5–15)
BUN: 16 mg/dL (ref 6–23)
CHLORIDE: 95 meq/L — AB (ref 96–112)
CO2: 24 mEq/L (ref 19–32)
Calcium: 7.7 mg/dL — ABNORMAL LOW (ref 8.4–10.5)
Creatinine, Ser: 1.36 mg/dL — ABNORMAL HIGH (ref 0.50–1.35)
GFR calc Af Amer: 55 mL/min — ABNORMAL LOW (ref >90)
GFR calc non Af Amer: 48 mL/min — ABNORMAL LOW (ref >90)
Glucose, Bld: 236 mg/dL — ABNORMAL HIGH (ref 70–99)
POTASSIUM: 5.1 meq/L (ref 3.7–5.3)
SODIUM: 133 meq/L — AB (ref 137–147)

## 2014-05-22 LAB — COMPREHENSIVE METABOLIC PANEL
ALBUMIN: 3.3 g/dL — AB (ref 3.5–5.2)
ALK PHOS: 72 U/L (ref 39–117)
ALT: 22 U/L (ref 0–53)
ANION GAP: 19 — AB (ref 5–15)
AST: 23 U/L (ref 0–37)
BUN: 15 mg/dL (ref 6–23)
CALCIUM: 9.5 mg/dL (ref 8.4–10.5)
CO2: 24 mEq/L (ref 19–32)
Chloride: 95 mEq/L — ABNORMAL LOW (ref 96–112)
Creatinine, Ser: 1.36 mg/dL — ABNORMAL HIGH (ref 0.50–1.35)
GFR calc non Af Amer: 48 mL/min — ABNORMAL LOW (ref >90)
GFR, EST AFRICAN AMERICAN: 55 mL/min — AB (ref >90)
Glucose, Bld: 159 mg/dL — ABNORMAL HIGH (ref 70–99)
POTASSIUM: 4.5 meq/L (ref 3.7–5.3)
SODIUM: 138 meq/L (ref 137–147)
TOTAL PROTEIN: 6.9 g/dL (ref 6.0–8.3)
Total Bilirubin: 1.6 mg/dL — ABNORMAL HIGH (ref 0.3–1.2)

## 2014-05-22 LAB — URINALYSIS, ROUTINE W REFLEX MICROSCOPIC
Glucose, UA: 100 mg/dL — AB
Glucose, UA: 100 mg/dL — AB
Hgb urine dipstick: NEGATIVE
KETONES UR: 15 mg/dL — AB
Ketones, ur: 15 mg/dL — AB
LEUKOCYTES UA: NEGATIVE
Leukocytes, UA: NEGATIVE
NITRITE: NEGATIVE
NITRITE: NEGATIVE
PH: 5 (ref 5.0–8.0)
PH: 5 (ref 5.0–8.0)
Protein, ur: 30 mg/dL — AB
Protein, ur: 100 mg/dL — AB
Specific Gravity, Urine: >1.046 — ABNORMAL HIGH (ref 1.005–1.030)
UROBILINOGEN UA: 1 mg/dL (ref 0.0–1.0)
UROBILINOGEN UA: 1 mg/dL (ref 0.0–1.0)

## 2014-05-22 LAB — CBC
HCT: 39.1 % (ref 39.0–52.0)
HEMOGLOBIN: 12.9 g/dL — AB (ref 13.0–17.0)
MCH: 31.1 pg (ref 26.0–34.0)
MCHC: 33 g/dL (ref 30.0–36.0)
MCV: 94.2 fL (ref 78.0–100.0)
PLATELETS: 449 10*3/uL — AB (ref 150–400)
RBC: 4.15 MIL/uL — ABNORMAL LOW (ref 4.22–5.81)
RDW: 14.2 % (ref 11.5–15.5)
WBC: 17.8 10*3/uL — AB (ref 4.0–10.5)

## 2014-05-22 LAB — I-STAT TROPONIN, ED: TROPONIN I, POC: 0.01 ng/mL (ref 0.00–0.08)

## 2014-05-22 LAB — LACTIC ACID, PLASMA: Lactic Acid, Venous: 2.4 mmol/L — ABNORMAL HIGH (ref 0.5–2.2)

## 2014-05-22 LAB — URINE MICROSCOPIC-ADD ON

## 2014-05-22 LAB — TYPE AND SCREEN
ABO/RH(D): O NEG
Antibody Screen: NEGATIVE

## 2014-05-22 LAB — LIPASE, BLOOD: LIPASE: 52 U/L (ref 11–59)

## 2014-05-22 LAB — ABO/RH: ABO/RH(D): O NEG

## 2014-05-22 LAB — GLUCOSE, CAPILLARY
Glucose-Capillary: 151 mg/dL — ABNORMAL HIGH (ref 70–99)
Glucose-Capillary: 256 mg/dL — ABNORMAL HIGH (ref 70–99)

## 2014-05-22 LAB — MRSA PCR SCREENING: MRSA BY PCR: NEGATIVE

## 2014-05-22 SURGERY — LAPAROTOMY, EXPLORATORY
Anesthesia: General | Site: Abdomen

## 2014-05-22 MED ORDER — CHLORHEXIDINE GLUCONATE 0.12 % MT SOLN
15.0000 mL | Freq: Two times a day (BID) | OROMUCOSAL | Status: DC
  Administered 2014-05-22 – 2014-06-03 (×23): 15 mL via OROMUCOSAL
  Filled 2014-05-22 (×25): qty 15

## 2014-05-22 MED ORDER — HEPARIN SODIUM (PORCINE) 5000 UNIT/ML IJ SOLN
5000.0000 [IU] | Freq: Three times a day (TID) | INTRAMUSCULAR | Status: DC
  Administered 2014-05-22 – 2014-05-31 (×25): 5000 [IU] via SUBCUTANEOUS
  Filled 2014-05-22 (×30): qty 1

## 2014-05-22 MED ORDER — ONDANSETRON HCL 4 MG/2ML IJ SOLN
INTRAMUSCULAR | Status: DC | PRN
  Administered 2014-05-22: 4 mg via INTRAVENOUS

## 2014-05-22 MED ORDER — PANTOPRAZOLE SODIUM 40 MG IV SOLR
40.0000 mg | INTRAVENOUS | Status: DC
  Administered 2014-05-22 – 2014-05-24 (×3): 40 mg via INTRAVENOUS
  Filled 2014-05-22 (×4): qty 40

## 2014-05-22 MED ORDER — SODIUM CHLORIDE 0.9 % IV SOLN
INTRAVENOUS | Status: DC
  Administered 2014-05-22 – 2014-05-28 (×11): via INTRAVENOUS

## 2014-05-22 MED ORDER — PROPOFOL 10 MG/ML IV BOLUS
INTRAVENOUS | Status: AC
  Filled 2014-05-22: qty 20

## 2014-05-22 MED ORDER — MIDAZOLAM HCL 2 MG/2ML IJ SOLN
INTRAMUSCULAR | Status: AC
  Filled 2014-05-22: qty 2

## 2014-05-22 MED ORDER — FENTANYL CITRATE 0.05 MG/ML IJ SOLN
INTRAMUSCULAR | Status: DC | PRN
  Administered 2014-05-22 (×2): 100 ug via INTRAVENOUS
  Administered 2014-05-22 (×2): 50 ug via INTRAVENOUS
  Administered 2014-05-22 (×2): 100 ug via INTRAVENOUS

## 2014-05-22 MED ORDER — LIDOCAINE HCL (CARDIAC) 20 MG/ML IV SOLN
INTRAVENOUS | Status: AC
  Filled 2014-05-22: qty 5

## 2014-05-22 MED ORDER — ONDANSETRON HCL 4 MG/2ML IJ SOLN: 4.0000 mg | Freq: Once | INTRAMUSCULAR | Status: DC | PRN

## 2014-05-22 MED ORDER — ALBUMIN HUMAN 5 % IV SOLN
INTRAVENOUS | Status: DC | PRN
  Administered 2014-05-22 (×2): via INTRAVENOUS

## 2014-05-22 MED ORDER — CIPROFLOXACIN IN D5W 400 MG/200ML IV SOLN
400.0000 mg | Freq: Two times a day (BID) | INTRAVENOUS | Status: AC
  Administered 2014-05-22 – 2014-05-28 (×14): 400 mg via INTRAVENOUS
  Filled 2014-05-22 (×15): qty 200

## 2014-05-22 MED ORDER — HYDROCORTISONE NA SUCCINATE PF 100 MG IJ SOLR
INTRAMUSCULAR | Status: AC
  Filled 2014-05-22: qty 2

## 2014-05-22 MED ORDER — PROPOFOL 10 MG/ML IV EMUL
5.0000 ug/kg/min | INTRAVENOUS | Status: DC
  Administered 2014-05-22: 25 ug/kg/min via INTRAVENOUS
  Filled 2014-05-22: qty 100

## 2014-05-22 MED ORDER — MORPHINE SULFATE 4 MG/ML IJ SOLN
4.0000 mg | Freq: Once | INTRAMUSCULAR | Status: AC
  Administered 2014-05-22: 4 mg via INTRAVENOUS
  Filled 2014-05-22: qty 1

## 2014-05-22 MED ORDER — HYDROCORTISONE NA SUCCINATE PF 100 MG IJ SOLR
100.0000 mg | Freq: Two times a day (BID) | INTRAMUSCULAR | Status: DC
  Administered 2014-05-22 – 2014-05-23 (×3): 100 mg via INTRAVENOUS
  Filled 2014-05-22 (×5): qty 2

## 2014-05-22 MED ORDER — ONDANSETRON HCL 4 MG/2ML IJ SOLN
4.0000 mg | Freq: Once | INTRAMUSCULAR | Status: AC
  Administered 2014-05-22: 4 mg via INTRAVENOUS
  Filled 2014-05-22: qty 2

## 2014-05-22 MED ORDER — LIDOCAINE HCL (CARDIAC) 20 MG/ML IV SOLN
INTRAVENOUS | Status: DC | PRN
  Administered 2014-05-22: 50 mg via INTRAVENOUS

## 2014-05-22 MED ORDER — SUCCINYLCHOLINE CHLORIDE 20 MG/ML IJ SOLN
INTRAMUSCULAR | Status: AC
  Filled 2014-05-22: qty 1

## 2014-05-22 MED ORDER — FENTANYL CITRATE 0.05 MG/ML IJ SOLN: 25.0000 ug | INTRAMUSCULAR | Status: DC | PRN

## 2014-05-22 MED ORDER — 0.9 % SODIUM CHLORIDE (POUR BTL) OPTIME
TOPICAL | Status: DC | PRN
Start: 2014-05-22 — End: 2014-05-22
  Administered 2014-05-22 (×2): 1000 mL

## 2014-05-22 MED ORDER — VECURONIUM BROMIDE 10 MG IV SOLR
INTRAVENOUS | Status: DC | PRN
  Administered 2014-05-22 (×2): 2 mg via INTRAVENOUS
  Administered 2014-05-22: 4 mg via INTRAVENOUS
  Administered 2014-05-22: 2 mg via INTRAVENOUS

## 2014-05-22 MED ORDER — CIPROFLOXACIN IN D5W 400 MG/200ML IV SOLN
400.0000 mg | Freq: Once | INTRAVENOUS | Status: AC
  Administered 2014-05-22: 400 mg via INTRAVENOUS
  Filled 2014-05-22: qty 200

## 2014-05-22 MED ORDER — PROPOFOL 10 MG/ML IV BOLUS
INTRAVENOUS | Status: DC | PRN
  Administered 2014-05-22: 100 mg via INTRAVENOUS

## 2014-05-22 MED ORDER — SODIUM CHLORIDE 0.9 % IJ SOLN
INTRAMUSCULAR | Status: AC
  Filled 2014-05-22: qty 10

## 2014-05-22 MED ORDER — METRONIDAZOLE IN NACL 5-0.79 MG/ML-% IV SOLN
500.0000 mg | Freq: Once | INTRAVENOUS | Status: AC
  Administered 2014-05-22: 500 mg via INTRAVENOUS
  Filled 2014-05-22: qty 100

## 2014-05-22 MED ORDER — SODIUM CHLORIDE 0.9 % IV SOLN
25.0000 ug/h | INTRAVENOUS | Status: DC
  Administered 2014-05-22: 300 ug/h via INTRAVENOUS
  Administered 2014-05-22: 50 ug/h via INTRAVENOUS
  Administered 2014-05-23: 300 ug/h via INTRAVENOUS
  Filled 2014-05-22 (×3): qty 50

## 2014-05-22 MED ORDER — CETYLPYRIDINIUM CHLORIDE 0.05 % MT LIQD
7.0000 mL | Freq: Four times a day (QID) | OROMUCOSAL | Status: DC
  Administered 2014-05-23 – 2014-06-02 (×31): 7 mL via OROMUCOSAL

## 2014-05-22 MED ORDER — FENTANYL CITRATE 0.05 MG/ML IJ SOLN
INTRAMUSCULAR | Status: AC
  Filled 2014-05-22: qty 5

## 2014-05-22 MED ORDER — LACTATED RINGERS IV SOLN
INTRAVENOUS | Status: DC | PRN
  Administered 2014-05-22: 05:00:00 via INTRAVENOUS

## 2014-05-22 MED ORDER — ONDANSETRON HCL 4 MG/2ML IJ SOLN
INTRAMUSCULAR | Status: AC
  Filled 2014-05-22: qty 2

## 2014-05-22 MED ORDER — HYDROCORTISONE NA SUCCINATE PF 100 MG IJ SOLR
INTRAMUSCULAR | Status: DC | PRN
  Administered 2014-05-22: 100 mg via INTRAVENOUS

## 2014-05-22 MED ORDER — IOHEXOL 300 MG/ML  SOLN
100.0000 mL | Freq: Once | INTRAMUSCULAR | Status: AC | PRN
Start: 2014-05-22 — End: 2014-05-22
  Administered 2014-05-22: 100 mL via INTRAVENOUS

## 2014-05-22 MED ORDER — IOHEXOL 300 MG/ML  SOLN
25.0000 mL | Freq: Once | INTRAMUSCULAR | Status: AC | PRN
  Administered 2014-05-22: 25 mL via ORAL

## 2014-05-22 MED ORDER — ONDANSETRON HCL 4 MG/2ML IJ SOLN
4.0000 mg | Freq: Four times a day (QID) | INTRAMUSCULAR | Status: DC | PRN
  Administered 2014-05-26 – 2014-06-01 (×2): 4 mg via INTRAVENOUS
  Filled 2014-05-22 (×2): qty 2

## 2014-05-22 MED ORDER — ALBUTEROL SULFATE (2.5 MG/3ML) 0.083% IN NEBU: 2.5000 mg | INHALATION_SOLUTION | RESPIRATORY_TRACT | Status: DC | PRN

## 2014-05-22 MED ORDER — METRONIDAZOLE IN NACL 5-0.79 MG/ML-% IV SOLN
500.0000 mg | Freq: Three times a day (TID) | INTRAVENOUS | Status: AC
  Administered 2014-05-22 – 2014-05-29 (×20): 500 mg via INTRAVENOUS
  Filled 2014-05-22 (×22): qty 100

## 2014-05-22 MED ORDER — POTASSIUM CHLORIDE IN NACL 20-0.45 MEQ/L-% IV SOLN
INTRAVENOUS | Status: DC
  Filled 2014-05-22 (×2): qty 1000

## 2014-05-22 MED ORDER — VECURONIUM BROMIDE 10 MG IV SOLR
INTRAVENOUS | Status: AC
  Filled 2014-05-22: qty 10

## 2014-05-22 MED ORDER — MIDAZOLAM HCL 5 MG/5ML IJ SOLN
INTRAMUSCULAR | Status: DC | PRN
  Administered 2014-05-22 (×2): 1 mg via INTRAVENOUS
  Administered 2014-05-22: 2 mg via INTRAVENOUS

## 2014-05-22 MED ORDER — ONDANSETRON HCL 4 MG PO TABS: 4.0000 mg | ORAL_TABLET | Freq: Four times a day (QID) | ORAL | Status: DC | PRN

## 2014-05-22 SURGICAL SUPPLY — 45 items
BLADE SURG ROTATE 9660 (MISCELLANEOUS) ×3 IMPLANT
BNDG GAUZE ELAST 4 BULKY (GAUZE/BANDAGES/DRESSINGS) ×3 IMPLANT
CANISTER SUCTION 2500CC (MISCELLANEOUS) ×3 IMPLANT
CHLORAPREP W/TINT 26ML (MISCELLANEOUS) ×3 IMPLANT
CLOSURE WOUND 1/4X4 (GAUZE/BANDAGES/DRESSINGS) ×1
COVER SURGICAL LIGHT HANDLE (MISCELLANEOUS) ×3 IMPLANT
DRAPE LAPAROSCOPIC ABDOMINAL (DRAPES) ×3 IMPLANT
DRAPE PROXIMA HALF (DRAPES) ×3 IMPLANT
DRAPE WARM FLUID 44X44 (DRAPE) ×3 IMPLANT
DRSG PAD ABDOMINAL 8X10 ST (GAUZE/BANDAGES/DRESSINGS) ×3 IMPLANT
ELECT CAUTERY BLADE 6.4 (BLADE) ×3 IMPLANT
GLOVE BIOGEL M STRL SZ7.5 (GLOVE) ×6 IMPLANT
GLOVE BIOGEL PI IND STRL 8 (GLOVE) ×1 IMPLANT
GLOVE BIOGEL PI INDICATOR 8 (GLOVE) ×2
GLOVE ECLIPSE 7.0 STRL STRAW (GLOVE) ×3 IMPLANT
GLOVE ECLIPSE 8.0 STRL XLNG CF (GLOVE) ×3 IMPLANT
GLOVE SKINSENSE NS SZ7.5 (GLOVE) ×2
GLOVE SKINSENSE STRL SZ7.5 (GLOVE) ×1 IMPLANT
GOWN STRL REUS W/ TWL LRG LVL3 (GOWN DISPOSABLE) ×2 IMPLANT
GOWN STRL REUS W/ TWL XL LVL3 (GOWN DISPOSABLE) ×1 IMPLANT
GOWN STRL REUS W/TWL LRG LVL3 (GOWN DISPOSABLE) ×4
GOWN STRL REUS W/TWL XL LVL3 (GOWN DISPOSABLE) ×2
KIT BASIN OR (CUSTOM PROCEDURE TRAY) ×3 IMPLANT
KIT ROOM TURNOVER OR (KITS) ×3 IMPLANT
LIGASURE IMPACT 36 18CM CVD LR (INSTRUMENTS) ×3 IMPLANT
NS IRRIG 1000ML POUR BTL (IV SOLUTION) ×6 IMPLANT
PACK GENERAL/GYN (CUSTOM PROCEDURE TRAY) ×3 IMPLANT
PAD ARMBOARD 7.5X6 YLW CONV (MISCELLANEOUS) ×3 IMPLANT
RELOAD PROXIMATE 75MM BLUE (ENDOMECHANICALS) ×6 IMPLANT
SPECIMEN JAR LARGE (MISCELLANEOUS) ×3 IMPLANT
SPONGE LAP 18X18 X RAY DECT (DISPOSABLE) ×3 IMPLANT
STAPLER PROXIMATE 75MM BLUE (STAPLE) ×3 IMPLANT
STAPLER VISISTAT 35W (STAPLE) ×3 IMPLANT
STRIP CLOSURE SKIN 1/4X4 (GAUZE/BANDAGES/DRESSINGS) ×2 IMPLANT
SUCTION POOLE TIP (SUCTIONS) ×3 IMPLANT
SUT PDS AB 1 TP1 96 (SUTURE) ×6 IMPLANT
SUT SILK 2 0 SH CR/8 (SUTURE) ×3 IMPLANT
SUT SILK 2 0 TIES 10X30 (SUTURE) ×3 IMPLANT
SUT SILK 3 0 SH CR/8 (SUTURE) ×9 IMPLANT
SUT SILK 3 0 TIES 10X30 (SUTURE) ×3 IMPLANT
TAPE CLOTH SURG 4X10 WHT LF (GAUZE/BANDAGES/DRESSINGS) ×3 IMPLANT
TOWEL OR 17X24 6PK STRL BLUE (TOWEL DISPOSABLE) ×3 IMPLANT
TOWEL OR 17X26 10 PK STRL BLUE (TOWEL DISPOSABLE) ×3 IMPLANT
TRAY FOLEY CATH 16FRSI W/METER (SET/KITS/TRAYS/PACK) ×3 IMPLANT
YANKAUER SUCT BULB TIP NO VENT (SUCTIONS) ×3 IMPLANT

## 2014-05-22 NOTE — Consult Note (Addendum)
PULMONARY / CRITICAL CARE MEDICINE   Name: [name] MRN: [id number] DOB: 29-Aug-1931    ADMISSION DATE:  05/22/2014 CONSULTATION DATE:  05/22/14  REFERRING MD :  CCS  CHIEF COMPLAINT:  Vent mgmt   INITIAL PRESENTATION: 80 yo male with hx HTN, myasthenia gravis, diverticulitis presented 11/21 with progressive abd pain and constipation.  CT findings c/w perforated diverticulitis and clinical picture concerning for worsening peritonitis.  He was taken to OR urgently for exp lap and small bowel resection.  Returned to ICU on vent post op and PCCM called to assist.   STUDIES:  CT abd/pelvis 11/21>>>Right lower quadrant inflammatory process most consistent with diverticulitis of the terminal ileum. There is mesenteric inflammation and micro perforation manifesting as several small extraluminal air bubbles. There is no drainable abscess or high-grade bowel obstruction.  SIGNIFICANT EVENTS: 11/21 admit, urgently to OR, exp lap, small bowel resection, to ICU on vent post op    HISTORY OF PRESENT ILLNESS:  80 yo male with hx HTN, myasthenia gravis, diverticulitis presented 11/21 with progressive abd pain and constipation.  CT findings c/w perforated diverticulitis and clinical picture concerning for worsening peritonitis.  He was taken to OR urgently for exp lap and small bowel resection.  Returned to ICU on vent post op and PCCM called to assist.    PAST MEDICAL HISTORY :   has a past medical history of Hypertension; Myasthenia gravis; Cellulitis; and Diverticula of small intestine.  has past surgical history that includes Eye surgery; Abdominal surgery; Cholecystectomy; and Small intestine surgery (2006). Prior to Admission medications   Medication Sig Start Date End Date Taking? Authorizing Provider  predniSONE (DELTASONE) 10 MG tablet Take 10 mg by mouth daily.   Yes Historical Provider, MD  amLODipine (NORVASC) 5 MG tablet Take 5 mg by mouth daily.    Historical Provider, MD   brimonidine-timolol (COMBIGAN) 0.2-0.5 % ophthalmic solution Place 1 drop into the left eye every 12 (twelve) hours.    Historical Provider, MD  furosemide (LASIX) 20 MG tablet Take 20 mg by mouth daily.    Historical Provider, MD  irbesartan (AVAPRO) 150 MG tablet Take 150 mg by mouth at bedtime.    Historical Provider, MD  nepafenac (NEVANAC) 0.1 % ophthalmic suspension Place 3 drops into the left eye 2 (two) times daily.     Historical Provider, MD  prednisoLONE acetate (PRED FORTE) 1 % ophthalmic suspension Place 1 drop into the left eye daily.     Historical Provider, MD   No Known Allergies  FAMILY HISTORY:  has no family status information on file.  SOCIAL HISTORY:  reports that he has quit smoking. He has never used smokeless tobacco. He reports that he does not drink alcohol or use illicit drugs.  REVIEW OF SYSTEMS:  Unable, pt sedated on vent.   SUBJECTIVE:   VITAL SIGNS: Temp:  [97.8 F (36.6 C)-98.9 F (37.2 C)] 97.8 F (36.6 C) (11/21 0752) Pulse Rate:  [93-105] 93 (11/21 0330) Resp:  [20-39] 28 (11/21 0330) BP: (126-179)/(40-86) 135/61 mmHg (11/21 0330) SpO2:  [93 %-100 %] 93 % (11/21 0330) FiO2 (%):  [50 %] 50 % (11/21 0735) Weight:  [220 lb 7.4 oz (100 kg)] 220 lb 7.4 oz (100 kg) (11/21 0500) HEMODYNAMICS:   VENTILATOR SETTINGS: Vent Mode:  [-] PRVC FiO2 (%):  [50 %] 50 % Set Rate:  [16 bmp] 16 bmp Vt Set:  [600 mL] 600 mL PEEP:  [5 cmH20] 5 cmH20 Plateau Pressure:  [18 cmH20] 18  cmH20 INTAKE / OUTPUT:  Intake/Output Summary (Last 24 hours) at 05/22/14 0832 Last data filed at 05/22/14 0709  Gross per 24 hour  Intake   3100 ml  Output    225 ml  Net   2875 ml    PHYSICAL EXAMINATION: General:  Elderly male, NAD sedated on vent  Neuro:  sedated on propofol, RASS -2, withdraws to pain  HEENT:  Mm dry, ETT, NO jvd Cardiovascular:  s1s2 rrr Lungs:  resps even, non labored on vent, few scattered rhonchi Abdomen:  Distended, -bs, midline incision c/d   Musculoskeletal:  Warm and dry, no sig edema   LABS:  CBC  Recent Labs Lab 05/22/14 0047  WBC 22.7*  HGB 15.4  HCT 46.2  PLT 611*   Coag's No results for input(s): APTT, INR in the last 168 hours. BMET  Recent Labs Lab 05/22/14 0047  NA 138  K 4.5  CL 95*  CO2 24  BUN 15  CREATININE 1.36*  GLUCOSE 159*   Electrolytes  Recent Labs Lab 05/22/14 0047  CALCIUM 9.5   Sepsis Markers No results for input(s): LATICACIDVEN, PROCALCITON, O2SATVEN in the last 168 hours. ABG No results for input(s): PHART, PCO2ART, PO2ART in the last 168 hours. Liver Enzymes  Recent Labs Lab 05/22/14 0047  AST 23  ALT 22  ALKPHOS 72  BILITOT 1.6*  ALBUMIN 3.3*   Cardiac Enzymes No results for input(s): TROPONINI, PROBNP in the last 168 hours. Glucose  Recent Labs Lab 05/22/14 0733  GLUCAP 151*    Imaging No results found.   ASSESSMENT / PLAN:  PULMONARY OETT 11/21>>> Acute respiratory failure - post op abd surgery  P:   Cont vent support - 8cc/kg  F/u CXR in am  ABG now  PRN BD  Will plan to rest on vent today, SBT in am if meets criteria   CARDIOVASCULAR CVL L [place] CVL 11/21>>> Hypotension - mild  Hx HTN Sepsis - abd  P:  Hold home norvasc, lasix, ARB  Check CVP  Trend lactate  Gentle volume  Wean propofol off as able - likely contributing to hypotension  Stress steroids as below - chronic pred   RENAL AKI - mild.  Baseline Scr ~1.1 P:   Gentle volume  F/u chem at noon and in am  F/u lactate, ABG  Change MIVF to NS   GASTROINTESTINAL Perforated small bowel diverticulitis - s/p exp lap with bowel resection 11/21 Peritonitis  progressive intra and extrahepatic biliary dilatation - hx cholecystectomy.  Bili only mildly elevated.  P:   Mgmt per surgery  NPO  NG to suction  PPI  F/u LFT's   HEMATOLOGIC No active issue  P:  F/u cbc  SQ heparin   INFECTIOUS Peritonitis  P:   BCx2 11/21>>> UC 11/21>>> Cipro 11/21>>> Flagyl  11/21>>>  ENDOCRINE Relative adrenal insufficiency - chronic prednisone (?r/t MG)  P:   Monitor glucose on chem  Consider SSI if >180 solucortef   NEUROLOGIC Sedated  P:   RASS goal: -1 Wean propofol as able  Add fentanyl for pain  Daily WUA   FAMILY  - Updates: no family available 11/21  Dirk DressKaty Whiteheart, NP 05/22/2014  8:32 AM Pager: [phone] or [phone]  Will rest for today, place orders for SBT in AM, in the meantime continue abx, support hemodynamically if needs be.  Sedation for the time being as ordered.  CC time independent of extender 35 min.  Patient seen and examined, agree with above  note.  I dictated the care and orders written for this patient under my direction.  Stress dose steroids.  Alyson ReedyWesam G Vedansh Kerstetter, MD [phone]

## 2014-05-22 NOTE — ED Notes (Signed)
Patient requested that we not contact his family. RN informed Careers advisersurgeon.

## 2014-05-22 NOTE — Progress Notes (Signed)
INITIAL NUTRITION ASSESSMENT  DOCUMENTATION CODES Per approved criteria  -Obesity Unspecified   INTERVENTION: -RD to follow for nutrition support measures  NUTRITION DIAGNOSIS: Inadequate oral intake related to inability to eat as evidenced by NPO.   Goal: Enteral nutrition to provide 60-70% of estimated calorie needs (22-25 kcals/kg ideal body weight) and 100% of estimated protein needs, based on ASPEN guidelines for hypocaloric, high protein feeding in critically ill obese individuals  Monitor:  Nutrition support measures, respiratory status, labs, weight changes, I/O's  Reason for Assessment: VDRF  80 y.o. male  Admitting Dx: <principal problem not specified>  80 yo male with hx HTN, myasthenia gravis, diverticulitis presented 11/21 with progressive abd pain and constipation. CT findings c/w perforated diverticulitis and clinical picture concerning for worsening peritonitis. He was taken to OR urgently for exp lap and small bowel resection. Returned to ICU on vent post op and PCCM called to assist.   ASSESSMENT: Pt s/p ex-lap on 05/22/14 due to perforated small bowel diverticulitis.  Noted NGT placed for decompression.  Nutrition-focused physical exam deferred due to pt completing x-rays at time of visit.  Patient is currently intubated on ventilator support MV: 9 L/min Temp (24hrs), Avg:98.1 F (36.7 C), Min:97.6 F (36.4 C), Max:98.9 F (37.2 C)  Labs reviewed. Cl: 95, Creat: 1.36, Glucose: 159. CBGS: 151.  Height: Ht Readings from Last 1 Encounters:  12/13/11 5\' 11"  (1.803 m)    Weight: Wt Readings from Last 1 Encounters:  05/22/14 220 lb 7.4 oz (100 kg)    Ideal Body Weight: 172#  % Ideal Body Weight: 128%  Wt Readings from Last 10 Encounters:  05/22/14 220 lb 7.4 oz (100 kg)  12/13/11 205 lb 12.8 oz (93.35 kg)    Usual Body Weight: 205#  % Usual Body Weight: 107%  BMI:  Body mass index is 30.76 kg/(m^2). Obesity, class I  Estimated  Nutritional Needs: Kcal: 1947.2 Underfeeding Kcal Needs: 1720-1955 Protein: >159 grams Fluid: >1.7 L  Skin: closed abdominal incision  Diet Order: Diet NPO time specified  EDUCATION NEEDS: -Education not appropriate at this time   Intake/Output Summary (Last 24 hours) at 05/22/14 1134 Last data filed at 05/22/14 1000  Gross per 24 hour  Intake 3238.3 ml  Output    225 ml  Net 3013.3 ml    Last BM: PTA  Labs:   Recent Labs Lab 05/22/14 0047  NA 138  K 4.5  CL 95*  CO2 24  BUN 15  CREATININE 1.36*  CALCIUM 9.5  GLUCOSE 159*    CBG (last 3)   Recent Labs  05/22/14 0733  GLUCAP 151*    Scheduled Meds: . ciprofloxacin  400 mg Intravenous Q12H  . heparin  5,000 Units Subcutaneous 3 times per day  . hydrocortisone sod succinate (SOLU-CORTEF) inj  100 mg Intravenous Q12H  . metronidazole  500 mg Intravenous Q8H  . pantoprazole (PROTONIX) IV  40 mg Intravenous Q24H    Continuous Infusions: . sodium chloride 100 mL/hr at 05/22/14 0905  . fentaNYL infusion INTRAVENOUS 100 mcg/hr (05/22/14 1007)  . propofol Stopped (05/22/14 1035)    Past Medical History  Diagnosis Date  . Hypertension   . Myasthenia gravis   . Cellulitis   . Diverticula of small intestine     Past Surgical History  Procedure Laterality Date  . Eye surgery    . Abdominal surgery    . Cholecystectomy    . Small intestine surgery  2006    perf SB diverticulitis  [name], RD, LDN Pager: [phone] After hours Pager: [phone]

## 2014-05-22 NOTE — Brief Op Note (Signed)
05/22/2014  7:05 AM  PATIENT:  [name]  80 y.o. male  PRE-OPERATIVE DIAGNOSIS:  Perforated small intestinal diverticulitis  POST-OPERATIVE DIAGNOSIS:  Perforated small intestinal diverticulitis  PROCEDURE:  Procedure(s): EXPLORATORY LAPAROTOMY (N/A) SMALL BOWEL RESECTION (N/A)  SURGEON:  Surgeon(s) and Role:    * Atilano InaEric M Sangeeta Youse, MD - Primary  PHYSICIAN ASSISTANT:   ASSISTANTS: none   ANESTHESIA:   general  EBL:     BLOOD ADMINISTERED:none  DRAINS: Nasogastric Tube and Urinary Catheter (Foley)   LOCAL MEDICATIONS USED:  NONE  SPECIMEN:  Source of Specimen:  distal small bowel  DISPOSITION OF SPECIMEN:  PATHOLOGY  COUNTS:  YES  TOURNIQUET:  * No tourniquets in log *  DICTATION: .Other Dictation: Dictation Number [id number]  PLAN OF CARE: ICU  PATIENT DISPOSITION:  ICU - intubated and hemodynamically stable.   Delay start of Pharmacological VTE agent (>24hrs) due to surgical blood loss or risk of bleeding: no  Mary SellaEric M. Andrey CampanileWilson, MD, FACS General, Bariatric, & Minimally Invasive Surgery [facility], [place]

## 2014-05-22 NOTE — ED Notes (Signed)
Pt reports lower abdominal pain since Thursday and urinary retention. Pt doubled over in pain in triage. Denies nvd.

## 2014-05-22 NOTE — ED Provider Notes (Signed)
CSN: [id number]     Arrival date & time 05/22/14  0039 History  This chart was scribed for Erik Raceravid Avriana Joo, MD by Roxy Cedarhandni Bhalodia, ED Scribe. This patient was seen in room A08C/A08C and the patient's care was started at 1:02 AM.   Chief Complaint  Patient presents with  . Abdominal Pain   Patient is a 80 y.o. male presenting with abdominal pain. The history is provided by the patient. No language interpreter was used.  Abdominal Pain Pain location:  Generalized Pain quality: aching and cramping   Pain radiates to:  Does not radiate Pain severity:  Moderate Onset quality:  Gradual Duration:  2 days Timing:  Constant Progression:  Worsening Chronicity:  New Relieved by:  Nothing Worsened by:  Nothing tried Ineffective treatments:  None tried Associated symptoms: shortness of breath   Associated symptoms: no chest pain, no chills, no cough, no dysuria, no fever, no hematuria, no nausea and no vomiting    HPI Comments: [name] is a 80 y.o. male with a history of myasthenia gravis, who presents to the Emergency Department complaining of constant, gradually worsening generalized abdominal pain that began yesterday. He states he last had a loose bowel movement yesterday. He denies associated nausea, vomiting. Had loose bowel movement yesterday but has not passed gas or had a bowel movement since. He denies history of similar symptoms in the past. Patient states he urinated prior to presentation to the emergency department. No fever or chills. Admits to abdominal distention with mild shortness of breath.  Past Medical History  Diagnosis Date  . Hypertension   . Myasthenia gravis   . Cellulitis    Past Surgical History  Procedure Laterality Date  . Eye surgery    . Abdominal surgery    . Cholecystectomy     Family History  Problem Relation Age of Onset  . Heart failure Father    History  Substance Use Topics  . Smoking status: Former Games developermoker  . Smokeless tobacco: Never  Used  . Alcohol Use: No   Review of Systems  Constitutional: Negative for fever and chills.  Eyes: Negative for visual disturbance.  Respiratory: Positive for shortness of breath. Negative for cough.   Cardiovascular: Negative for chest pain, palpitations and leg swelling.  Gastrointestinal: Positive for abdominal pain and abdominal distention. Negative for nausea and vomiting.  Genitourinary: Positive for difficulty urinating. Negative for dysuria, hematuria and flank pain.  Musculoskeletal: Negative for back pain, neck pain and neck stiffness.  Skin: Negative for rash and wound.  Neurological: Negative for dizziness, weakness, light-headedness, numbness and headaches.  All other systems reviewed and are negative.  Allergies  Review of patient's allergies indicates no known allergies.  Home Medications   Prior to Admission medications   Medication Sig Start Date End Date Taking? Authorizing Provider  predniSONE (DELTASONE) 10 MG tablet Take 10 mg by mouth daily.   Yes Historical Provider, MD  amLODipine (NORVASC) 5 MG tablet Take 5 mg by mouth daily.    Historical Provider, MD  brimonidine-timolol (COMBIGAN) 0.2-0.5 % ophthalmic solution Place 1 drop into the left eye every 12 (twelve) hours.    Historical Provider, MD  furosemide (LASIX) 20 MG tablet Take 20 mg by mouth daily.    Historical Provider, MD  irbesartan (AVAPRO) 150 MG tablet Take 150 mg by mouth at bedtime.    Historical Provider, MD  nepafenac (NEVANAC) 0.1 % ophthalmic suspension Place 3 drops into the left eye 2 (two) times daily.  Historical Provider, MD  prednisoLONE acetate (PRED FORTE) 1 % ophthalmic suspension Place 1 drop into the left eye daily.     Historical Provider, MD   Triage Vitals: BP 157/79 mmHg  Pulse 95  Temp(Src) 98.9 F (37.2 C) (Oral)  Resp 20  SpO2 100%  Physical Exam  Constitutional: He is oriented to person, place, and time. He appears well-developed and well-nourished. No distress.   Uncomfortable appearing  HENT:  Head: Normocephalic and atraumatic.  Mouth/Throat: Oropharynx is clear and moist.  Eyes: EOM are normal. Pupils are equal, round, and reactive to light.  Neck: Normal range of motion. Neck supple.  Cardiovascular: Normal rate and regular rhythm.   Pulmonary/Chest: Effort normal and breath sounds normal. No respiratory distress. He has no wheezes. He has no rales. He exhibits no tenderness.  Abdominal: Soft. He exhibits distension (distended tympanic abdomen). There is tenderness (significant diffuse tenderness to palpation.).  High-pitched bowel sounds  Genitourinary:  Large right inguinal herniation.  Musculoskeletal: Normal range of motion. He exhibits no edema or tenderness.  Neurological: He is alert and oriented to person, place, and time.  Moves all extremities without deficit. Sensation is grossly intact.  Skin: Skin is warm and dry. No rash noted. No erythema.  Psychiatric: He has a normal mood and affect. His behavior is normal.  Nursing note and vitals reviewed.   ED Course  Procedures (including critical care time)  DIAGNOSTIC STUDIES: Oxygen Saturation is 100% on RA, normal by my interpretation.    COORDINATION OF CARE: 1:05 AM- Discussed plans to order diagnostic lab work. Pt advised of plan for treatment and pt agrees.  Labs Review Labs Reviewed  CBC WITH DIFFERENTIAL - Abnormal; Notable for the following:    WBC 22.7 (*)    Platelets 611 (*)    Neutro Abs 15.5 (*)    Lymphs Abs 5.9 (*)    Monocytes Absolute 1.3 (*)    All other components within normal limits  COMPREHENSIVE METABOLIC PANEL - Abnormal; Notable for the following:    Chloride 95 (*)    Glucose, Bld 159 (*)    Creatinine, Ser 1.36 (*)    Albumin 3.3 (*)    Total Bilirubin 1.6 (*)    GFR calc non Af Amer 48 (*)    GFR calc Af Amer 55 (*)    Anion gap 19 (*)    All other components within normal limits  LIPASE, BLOOD  URINALYSIS, ROUTINE W REFLEX MICROSCOPIC   I-STAT TROPOININ, ED   Imaging Review Ct Abdomen Pelvis W Contrast  05/22/2014   CLINICAL DATA:  Low abdominal pain with urinary retention for 2 days. Initial encounter.  EXAM: CT ABDOMEN AND PELVIS WITH CONTRAST  TECHNIQUE: Multidetector CT imaging of the abdomen and pelvis was performed using the standard protocol following bolus administration of intravenous contrast.  CONTRAST:  100mL OMNIPAQUE IOHEXOL 300 MG/ML  SOLN  COMPARISON:  Abdominal pelvic CT 09/01/2008.  FINDINGS: Lower chest: Mild chronic dependent atelectasis or scarring in both lung bases is similar to the prior examination. There is no confluent airspace opacity, pleural or pericardial effusion. A small hiatal hernia is noted.  Hepatobiliary: There is a stable small cyst in the left hepatic lobe. Patient has undergone interval cholecystectomy. There is mild intra and extrahepatic biliary dilatation. The common bile duct measures up to 1.7 cm in diameter. The duct tapers distally. No intraductal calculus identified.  Pancreas: No evidence of pancreatic mass or pancreatic ductal dilatation. No surrounding inflammatory change.  Spleen: Stable appearance. There is a small low-density lesions centrally in the spleen which is unchanged.  Adrenals/Urinary Tract: Both adrenal glands appear normal.Bilateral renal cysts are stable. There is no hydronephrosis. Mild bladder wall thickening appears chronic and unchanged.  Stomach/Bowel: The stomach appears normal. There is a stable diverticulum of the proximal duodenum without surrounding inflammatory change. The mid small bowel is mildly distended. There is an inflammatory process in the right lower quadrant with infiltration of the mesenteric fat and several ill-defined extraluminal air bubbles. This process appears to arise from the terminal ileum which demonstrates wall thickening and several diverticula, most apparent on the coronal images. In conjunction with the prior examination, the appendix is  visualized on axial image 73 and is normal in caliber. This is lateral to the epicenter of the inflammatory process. There is no colonic wall thickening. A portion of the sigmoid colon extends into a right inguinal hernia which has enlarged. However, there is no evidence of incarceration or bowel obstruction.No ascites or focal extraluminal fluid collection.  Vascular/Lymphatic: There are no enlarged abdominal or pelvic lymph nodes. There is stable atherosclerosis of the aorta, its branches and the iliac arteries.  Reproductive: The prostate gland is moderately enlarged with nodularity protruding into the bladder base, similar to the prior examination.  Other: As above, there is an enlarging right inguinal hernia containing sigmoid colon. There is no evidence of incarceration or bowel obstruction. No other abdominal wall hernia is demonstrated.  Musculoskeletal: No acute or significant osseous findings.  IMPRESSION: 1. Right lower quadrant inflammatory process most consistent with diverticulitis of the terminal ileum. There is mesenteric inflammation and micro perforation manifesting as several small extraluminal air bubbles. There is no drainable abscess or high-grade bowel obstruction. 2. Enlarging right inguinal hernia containing sigmoid colon. No evidence of incarceration. 3. Mildly progressive intra and extrahepatic biliary dilatation status post interval cholecystectomy. Total bilirubin appears only mildly elevated. Follow up of liver functions studies recommended. 4. Stable enlargement of the prostate gland. 5. No significant adenopathy demonstrated. 6. These results were called by telephone at the time of interpretation on 05/22/2014 at 2:50 am to Dr. Loren Racer , who verbally acknowledged these results.   Electronically Signed   By: Roxy Horseman M.D.   On: 05/22/2014 02:50   Dg Chest Port 1 View  05/22/2014   CLINICAL DATA:  Progressive generalized abdominal pain for 1 day. Loose stool. No nausea  or vomiting. Initial encounter.  EXAM: PORTABLE CHEST - 1 VIEW  COMPARISON:  09/01/2008.  FINDINGS: 0143 hr. There are significantly lower lung volumes with increased patchy bibasilar opacities most consistent with atelectasis. The heart size and mediastinal contours are stable for the degree of inspiration. No pneumothorax or significant pleural effusion identified. The bones appear unremarkable. Multiple telemetry leads overlie the chest.  IMPRESSION: Significantly lower lung volumes with associated bibasilar atelectasis. No suspected acute chest findings.   Electronically Signed   By: Roxy Horseman M.D.   On: 05/22/2014 01:55     EKG Interpretation None     MDM   Final diagnoses:  Abdominal pain  SOB (shortness of breath)  Diverticulitis of small intestine with perforation without bleeding      I personally performed the services described in this documentation, which was scribed in my presence. The recorded information has been reviewed and is accurate.  Discussed with Dr. Andrey Campanile. Will see patient in the emergency department and admit.  Erik Racer, MD 05/22/14 [phone]

## 2014-05-22 NOTE — H&P (Signed)
[name] is an 80 y.o. male.   Chief Complaint: abd pain HPI: 80 yo obese WM came to ED earlier tonight for persistent and worsening generalized abdominal pain. Pt states he woke up Thursday AM with RLQ abd pain that then spread throughout abdomen and was constant. The pain has gotten progressively worse. He also relates a change in his bowel function. He has become constipated - last BM Thursday. No flatus today. No vomiting or f/c. Also reports some difficulty urinating. Has sensation to void but just dribbles. Takes plavix daily. In ED had labs and CT and CCS was called to evaluate.   He has prior history of SB diverticulitis that perforated in 2006 requiring SB resection by Dr Zella Richer. Path showed no evidence of IBD.   Past Medical History  Diagnosis Date  . Hypertension   . Myasthenia gravis   . Cellulitis   . Diverticula of small intestine     Past Surgical History  Procedure Laterality Date  . Eye surgery    . Abdominal surgery    . Cholecystectomy    . Small intestine surgery  2006    perf SB diverticulitis    Family History  Problem Relation Age of Onset  . Heart failure Father    Social History:  reports that he has quit smoking. He has never used smokeless tobacco. He reports that he does not drink alcohol or use illicit drugs.  Allergies: No Known Allergies   (Not in a hospital admission)  Results for orders placed or performed during the hospital encounter of 05/22/14 (from the past 48 hour(s))  CBC with Differential     Status: Abnormal   Collection Time: 05/22/14 12:47 AM  Result Value Ref Range   WBC 22.7 (H) 4.0 - 10.5 K/uL   RBC 4.79 4.22 - 5.81 MIL/uL   Hemoglobin 15.4 13.0 - 17.0 g/dL   HCT 46.2 39.0 - 52.0 %   MCV 96.5 78.0 - 100.0 fL   MCH 32.2 26.0 - 34.0 pg   MCHC 33.3 30.0 - 36.0 g/dL   RDW 13.9 11.5 - 15.5 %   Platelets 611 (H) 150 - 400 K/uL   Neutrophils Relative % 68 43 - 77 %   Neutro Abs 15.5 (H) 1.7 - 7.7 K/uL   Lymphocytes  Relative 26 12 - 46 %   Lymphs Abs 5.9 (H) 0.7 - 4.0 K/uL   Monocytes Relative 6 3 - 12 %   Monocytes Absolute 1.3 (H) 0.1 - 1.0 K/uL   Eosinophils Relative 0 0 - 5 %   Eosinophils Absolute 0.0 0.0 - 0.7 K/uL   Basophils Relative 0 0 - 1 %   Basophils Absolute 0.0 0.0 - 0.1 K/uL  Comprehensive metabolic panel     Status: Abnormal   Collection Time: 05/22/14 12:47 AM  Result Value Ref Range   Sodium 138 137 - 147 mEq/L   Potassium 4.5 3.7 - 5.3 mEq/L   Chloride 95 (L) 96 - 112 mEq/L   CO2 24 19 - 32 mEq/L   Glucose, Bld 159 (H) 70 - 99 mg/dL   BUN 15 6 - 23 mg/dL   Creatinine, Ser 1.36 (H) 0.50 - 1.35 mg/dL   Calcium 9.5 8.4 - 10.5 mg/dL   Total Protein 6.9 6.0 - 8.3 g/dL   Albumin 3.3 (L) 3.5 - 5.2 g/dL   AST 23 0 - 37 U/L   ALT 22 0 - 53 U/L   Alkaline Phosphatase 72 39 - 117  U/L   Total Bilirubin 1.6 (H) 0.3 - 1.2 mg/dL   GFR calc non Af Amer 48 (L) >90 mL/min   GFR calc Af Amer 55 (L) >90 mL/min    Comment: (NOTE) The eGFR has been calculated using the CKD EPI equation. This calculation has not been validated in all clinical situations. eGFR's persistently <90 mL/min signify possible Chronic Kidney Disease.    Anion gap 19 (H) 5 - 15  Lipase, blood     Status: None   Collection Time: 05/22/14 12:47 AM  Result Value Ref Range   Lipase 52 11 - 59 U/L  I-stat troponin, ED     Status: None   Collection Time: 05/22/14  1:38 AM  Result Value Ref Range   Troponin i, poc 0.01 0.00 - 0.08 ng/mL   Comment 3            Comment: Due to the release kinetics of cTnI, a negative result within the first hours of the onset of symptoms does not rule out myocardial infarction with certainty. If myocardial infarction is still suspected, repeat the test at appropriate intervals.    Ct Abdomen Pelvis W Contrast  05/22/2014   CLINICAL DATA:  Low abdominal pain with urinary retention for 2 days. Initial encounter.  EXAM: CT ABDOMEN AND PELVIS WITH CONTRAST  TECHNIQUE: Multidetector CT  imaging of the abdomen and pelvis was performed using the standard protocol following bolus administration of intravenous contrast.  CONTRAST:  153m OMNIPAQUE IOHEXOL 300 MG/ML  SOLN  COMPARISON:  Abdominal pelvic CT 09/01/2008.  FINDINGS: Lower chest: Mild chronic dependent atelectasis or scarring in both lung bases is similar to the prior examination. There is no confluent airspace opacity, pleural or pericardial effusion. A small hiatal hernia is noted.  Hepatobiliary: There is a stable small cyst in the left hepatic lobe. Patient has undergone interval cholecystectomy. There is mild intra and extrahepatic biliary dilatation. The common bile duct measures up to 1.7 cm in diameter. The duct tapers distally. No intraductal calculus identified.  Pancreas: No evidence of pancreatic mass or pancreatic ductal dilatation. No surrounding inflammatory change.  Spleen: Stable appearance. There is a small low-density lesions centrally in the spleen which is unchanged.  Adrenals/Urinary Tract: Both adrenal glands appear normal.Bilateral renal cysts are stable. There is no hydronephrosis. Mild bladder wall thickening appears chronic and unchanged.  Stomach/Bowel: The stomach appears normal. There is a stable diverticulum of the proximal duodenum without surrounding inflammatory change. The mid small bowel is mildly distended. There is an inflammatory process in the right lower quadrant with infiltration of the mesenteric fat and several ill-defined extraluminal air bubbles. This process appears to arise from the terminal ileum which demonstrates wall thickening and several diverticula, most apparent on the coronal images. In conjunction with the prior examination, the appendix is visualized on axial image 73 and is normal in caliber. This is lateral to the epicenter of the inflammatory process. There is no colonic wall thickening. A portion of the sigmoid colon extends into a right inguinal hernia which has enlarged.  However, there is no evidence of incarceration or bowel obstruction.No ascites or focal extraluminal fluid collection.  Vascular/Lymphatic: There are no enlarged abdominal or pelvic lymph nodes. There is stable atherosclerosis of the aorta, its branches and the iliac arteries.  Reproductive: The prostate gland is moderately enlarged with nodularity protruding into the bladder base, similar to the prior examination.  Other: As above, there is an enlarging right inguinal hernia containing sigmoid colon. There  is no evidence of incarceration or bowel obstruction. No other abdominal wall hernia is demonstrated.  Musculoskeletal: No acute or significant osseous findings.  IMPRESSION: 1. Right lower quadrant inflammatory process most consistent with diverticulitis of the terminal ileum. There is mesenteric inflammation and micro perforation manifesting as several small extraluminal air bubbles. There is no drainable abscess or high-grade bowel obstruction. 2. Enlarging right inguinal hernia containing sigmoid colon. No evidence of incarceration. 3. Mildly progressive intra and extrahepatic biliary dilatation status post interval cholecystectomy. Total bilirubin appears only mildly elevated. Follow up of liver functions studies recommended. 4. Stable enlargement of the prostate gland. 5. No significant adenopathy demonstrated. 6. These results were called by telephone at the time of interpretation on 05/22/2014 at 2:50 am to Dr. Julianne Rice , who verbally acknowledged these results.   Electronically Signed   By: Camie Patience M.D.   On: 05/22/2014 02:50   Dg Chest Port 1 View  05/22/2014   CLINICAL DATA:  Progressive generalized abdominal pain for 1 day. Loose stool. No nausea or vomiting. Initial encounter.  EXAM: PORTABLE CHEST - 1 VIEW  COMPARISON:  09/01/2008.  FINDINGS: 0143 hr. There are significantly lower lung volumes with increased patchy bibasilar opacities most consistent with atelectasis. The heart size  and mediastinal contours are stable for the degree of inspiration. No pneumothorax or significant pleural effusion identified. The bones appear unremarkable. Multiple telemetry leads overlie the chest.  IMPRESSION: Significantly lower lung volumes with associated bibasilar atelectasis. No suspected acute chest findings.   Electronically Signed   By: Camie Patience M.D.   On: 05/22/2014 01:55    Review of Systems  Constitutional: Negative for fever, chills and weight loss.  HENT: Negative for nosebleeds.   Eyes: Negative for blurred vision.  Respiratory: Negative for shortness of breath.   Cardiovascular: Negative for chest pain, palpitations, orthopnea and PND.       Denies DOE  Gastrointestinal: Positive for nausea, abdominal pain and constipation. Negative for vomiting.  Genitourinary: Negative for dysuria and hematuria.       Some difficulty urinating past day or so - dribbling  Musculoskeletal: Negative.   Skin: Negative for itching and rash.  Neurological: Negative for dizziness, focal weakness, seizures, loss of consciousness and headaches.       Denies TIAs, amaurosis fugax  Endo/Heme/Allergies: Does not bruise/bleed easily.  Psychiatric/Behavioral: The patient is not nervous/anxious.     Blood pressure 135/61, pulse 93, temperature 98.9 F (37.2 C), temperature source Oral, resp. rate 28, SpO2 93 %. Physical Exam  Vitals reviewed. Constitutional: He is oriented to person, place, and time. He appears well-developed and well-nourished. He is cooperative. He appears ill. No distress.  Obese wm; ill appearing  HENT:  Head: Normocephalic and atraumatic. Head is without raccoon's eyes, without Battle's sign, without abrasion, without contusion and without laceration.  Right Ear: Hearing, tympanic membrane, external ear and ear canal normal. No lacerations. No drainage or tenderness. No foreign bodies. Tympanic membrane is not perforated. No hemotympanum.  Left Ear: Hearing, tympanic  membrane, external ear and ear canal normal. No lacerations. No drainage or tenderness. No foreign bodies. Tympanic membrane is not perforated. No hemotympanum.  Nose: Nose normal. No nose lacerations, sinus tenderness, nasal deformity or nasal septal hematoma. No epistaxis.  Mouth/Throat: Uvula is midline, oropharynx is clear and moist and mucous membranes are normal. No lacerations.  Eyes: Conjunctivae, EOM and lids are normal. Pupils are equal, round, and reactive to light. No scleral icterus.  Neck: Trachea normal.  No JVD present. No spinous process tenderness and no muscular tenderness present. Carotid bruit is not present. No thyromegaly present.  Cardiovascular: Normal rate, regular rhythm, normal heart sounds and normal pulses.   Respiratory: Effort normal and breath sounds normal. No respiratory distress. He exhibits no tenderness, no bony tenderness, no laceration and no crepitus.  GI: Soft. Normal appearance. He exhibits distension. Bowel sounds are decreased. There is tenderness. There is guarding. There is no rigidity, no rebound and no CVA tenderness. A hernia is present. Hernia confirmed positive in the right inguinal area.    Midline incision with very mild cellulitis above/below umbilicus. Small reducible umbilical hernia. Diffuse TTP. +voluntary guarding  Genitourinary:  Large right inguinal hernia - nontender  Musculoskeletal: Normal range of motion. He exhibits no edema or tenderness.  Lymphadenopathy:    He has no cervical adenopathy.  Neurological: He is alert and oriented to person, place, and time. He has normal strength. No cranial nerve deficit or sensory deficit. GCS eye subscore is 4. GCS verbal subscore is 5. GCS motor subscore is 6.  Skin: Skin is warm, dry and intact. Bruising (extensive) and ecchymosis noted. He is not diaphoretic.  Some brown hyperpigmentation of b/l LE; no open LE wounds; paper thin skin  Psychiatric: He has a normal mood and affect. His speech is  normal and behavior is normal. Judgment and thought content normal.     Assessment/Plan Perforated Distal Small Bowel Diverticulitis HTN Myasthenia Gravis Large RIH Elevated Creatinine Mild protein calorie malnutrition.   Given wbc, clinical exam (bordering on peritonitis), CT findings c/w perforated SB diveritculitis, I recommended proceeding to the OR for exp lap, bowel resection, possible ostomy. I discussed risk/benefits including but not limited bleeding, infection, injury to other structures, hernia, anastomotic leak/stricture, abscess, ileus, prolonged hospital course, PNA, perioperative cardiac risks.   i asked him several times if he wanted to speak with his significant others or me too - he declined each time.   Leighton Ruff. Redmond Pulling, MD, FACS General, Bariatric, & Minimally Invasive Surgery [facility] Surgery, [place]   [facility] M 05/22/2014, 4:03 AM

## 2014-05-22 NOTE — Transfer of Care (Signed)
Immediate Anesthesia Transfer of Care Note  Patient: Erik MilesCharlie W [name]  Procedure(s) Performed: Procedure(s): EXPLORATORY LAPAROTOMY (N/A) SMALL BOWEL RESECTION (N/A)  Patient Location: ICU  Anesthesia Type:General  Level of Consciousness: sedated and unresponsive  Airway & Oxygen Therapy: Patient remains intubated per anesthesia plan and Patient placed on Ventilator (see vital sign flow sheet for setting)  Post-op Assessment: Report given to PACU RN and Post -op Vital signs reviewed and stable  Post vital signs: Reviewed and stable  Complications: No apparent anesthesia complications

## 2014-05-22 NOTE — Anesthesia Preprocedure Evaluation (Addendum)
Anesthesia Evaluation  Patient identified by MRN, date of birth, ID band Patient awake    Reviewed: Allergy & Precautions, H&P , NPO status , Patient's Chart, lab work & pertinent test results  Airway Mallampati: II  TM Distance: >3 FB Neck ROM: Full    Dental  (+) Edentulous Upper   Pulmonary former smoker,  breath sounds clear to auscultation        Cardiovascular hypertension, Rhythm:Irregular     Neuro/Psych    GI/Hepatic   Endo/Other    Renal/GU      Musculoskeletal   Abdominal (+)  Abdomen: tender.    Peds  Hematology   Anesthesia Other Findings   Reproductive/Obstetrics                           Anesthesia Physical Anesthesia Plan  ASA: III and emergent  Anesthesia Plan: General   Post-op Pain Management:    Induction: Intravenous  Airway Management Planned: Oral ETT  Additional Equipment: Arterial line  Intra-op Plan:   Post-operative Plan: Post-operative intubation/ventilation  Informed Consent: I have reviewed the patients History and Physical, chart, labs and discussed the procedure including the risks, benefits and alternatives for the proposed anesthesia with the patient or authorized representative who has indicated his/her understanding and acceptance.     Plan Discussed with: CRNA and Anesthesiologist  Anesthesia Plan Comments: (Acute abdomen, probable perforated acute diverticulitis Myasthenia gravis on chronic prednisone Hypertension Large RIH Renal insufficiency Cr 1.36 LBBB with frequent PVCs, no documented history of heart disease  Plan GA with Oral ETT  [name] )       Anesthesia Quick Evaluation

## 2014-05-22 NOTE — Anesthesia Postprocedure Evaluation (Signed)
  Anesthesia Post-op Note  Patient: [name]  Procedure(s) Performed: Procedure(s): EXPLORATORY LAPAROTOMY (N/A) SMALL BOWEL RESECTION (N/A)  Patient Location: ICU  Anesthesia Type:General  Level of Consciousness: sedated, unresponsive and Patient remains intubated per anesthesia plan  Airway and Oxygen Therapy: Patient remains intubated per anesthesia plan  Post-op Pain: unable to evaluate, pt sedated  Post-op Assessment: Post-op Vital signs reviewed, Patient's Cardiovascular Status Stable and Respiratory Function Stable  Post-op Vital Signs: Reviewed and stable  Last Vitals:  Filed Vitals:   05/22/14 0330  BP: 135/61  Pulse: 93  Temp:   Resp: 28    Complications: No apparent anesthesia complications

## 2014-05-22 NOTE — ED Notes (Signed)
Patient transported to CT 

## 2014-05-22 NOTE — ED Notes (Signed)
Patient returned from CT

## 2014-05-22 NOTE — ED Notes (Signed)
Notified CT that pt finished contrast 

## 2014-05-22 NOTE — Op Note (Signed)
NAMWaldo [name]:  [name], [name]            ACCOUNT NO.:  [id number]  MEDICAL RECORD NO.:  [id number]  LOCATION:  2M08C                        FACILITY:  [facility]  PHYSICIAN:  Mary Sellaric M. Andrey CampanileWilson, MD, FACSDATE OF BIRTH:  06/01/1932  DATE OF PROCEDURE:  05/22/2014 DATE OF DISCHARGE:                              OPERATIVE REPORT   PREOPERATIVE DIAGNOSIS:  Perforated small bowel diverticulitis.  POSTOPERATIVE DIAGNOSIS:  Perforated small bowel diverticulitis.  PROCEDURE:  Exploratory laparotomy with distal small bowel resection with side-to-side anastomosis.  SURGEON:  Mary Sellaric M. Andrey CampanileWilson, MD, FACS.  ANESTHESIA:  General.  ESTIMATED BLOOD LOSS:  Minimal.  INDICATIONS FOR PROCEDURE:  The patient is a pleasant 80 year old gentleman with a history of hypertension, myasthenia gravis, and small bowel diverticular disease who has had ongoing persistent abdominal pain since Thursday.  It did not get better, and it prompted him to come to the emergency room.  A CT scan demonstrated findings of distal small bowel diverticulitis with extraluminal air.  He was guarding on exam. Along with the CT findings, I recommended exploratory laparotomy with bowel resection with possible ostomy.  We discussed at length the risks and benefits of surgery including, but not limited to, bleeding, infection, injury to surrounding structures, anastomotic leak, anastomotic stricture, incisional hernia, wound infection, blood clot formation, prolonged intubation, prolonged hospital course, cardiac and pulmonary issues,  potential need for blood transfusion.  Interestingly, he had a similar problem occur in 2006 requiring small bowel resection as well.  DESCRIPTION OF PROCEDURE:  After obtaining informed consent, the [facility], placed supine on the operating room table.  General endotracheal anesthesia was established. Sequential compression devices were placed.  A Foley catheter  was placed.  Nasogastric tube was placed.  Anesthesia placed a left subclavian central venous catheter as well as a radial A-line.  His abdomen was prepped and draped in the usual standard surgical fashion. He received Cipro and Flagyl in the ER.  The surgical time-out was performed.  I incised the skin incision with #15 blade through his old midline scar.  The subcutaneous tissue was divided with electrocautery. The fascia was entered in the upper abdomen with electrocautery.  He did have omental adhesions to the anterior abdominal wall.  These were taken down with Bovie electrocautery.  I then opened the remainder of the fascia.  He had seropurulent fluid in his abdomen.  There was no stool contamination.  The mid small bowel to distal small bowel had as best described a friable mesentery and running the small bowel some of the mesentery, just short of tore slightly.  I ran the bowel distally and found the area of concern.  There was a focal segment of distal ileum that was severely thickened that had a perforation in the mesenteric side of the bowel.  I ran the bowel distally to the terminal ileum and the cecum.  This appeared normal.  There was some creeping fat through about half of his small bowel, but I did review his pathological report from 2006 and there was no evidence of inflammatory bowel disease at that time.  I transected an area of what appeared to be healthy bowel proximal  to the area of perforation with a GIA 75 stapler as well as downstream from the area of perforation with another fire of the GIA 75 blue load stapler.  The mesentery was then taken down in a serial fashion with LigaSure device.  The specimen was passed off the field.  I then brought the small bowel together in a side-to-side fashion and lined up  the staple lines and placed a stay suture.  The corner of the staple line was then excised with electrocautery.  One fork of the GIA 75 stapler was placed  through each enterotomy.  The staple was brought together and fired to create a common channel.  Because of some of the thickness of the mesentery, I elected to close the common opening with interrupted 3-0 silk sutures.  The crotch suture was placed as well. I then closed the mesenteric defect in interrupted fashion with 2-0 silk sutures.  The abdomen was irrigated.  I reinspected the remainder of the bowel.  There was no evidence of active disease or perforation on the part of the bowel.  His old anastomosis was visible in the upper proximal small intestine.  I was unable to palpate the stomach due to the intraabdominal visceral fat.  The abdomen was closed with a running looped #1 PDS from above and from below.  The skin was left open and packed with moist Kerlix followed by dry gauze.  The patient was left intubated and taken to the ICU in stable condition.  All needle, instrument, and sponge counts were correct x2.  There were no immediate complications.  The patient tolerated the procedure well.     Mary SellaEric M. Andrey CampanileWilson, MD, FACS     EMW/MEDQ  D:  05/22/2014  T:  05/22/2014  Job:  [id number]

## 2014-05-22 NOTE — Anesthesia Procedure Notes (Signed)
Procedure Name: Intubation Date/Time: 05/22/2014 4:51 AM Performed by: Nicholos JohnsMCPHAIL, Draylon Mercadel S Pre-anesthesia Checklist: Patient identified, Timeout performed, Emergency Drugs available, Suction available and Patient being monitored Patient Re-evaluated:Patient Re-evaluated prior to inductionOxygen Delivery Method: Circle system utilized Preoxygenation: Pre-oxygenation with 100% oxygen Intubation Type: IV induction and Cricoid Pressure applied Ventilation: Mask ventilation without difficulty Laryngoscope Size: Mac and 4 Grade View: Grade I Tube type: Subglottic suction tube Tube size: 7.5 mm Number of attempts: 1 Airway Equipment and Method: Stylet Placement Confirmation: ETT inserted through vocal cords under direct vision,  positive ETCO2 and breath sounds checked- equal and bilateral Secured at: 21 cm Tube secured with: Tape Dental Injury: Teeth and Oropharynx as per pre-operative assessment  Comments: Did not use muscle relaxant for induction (myasthenia gravis).

## 2014-05-23 ENCOUNTER — Inpatient Hospital Stay (HOSPITAL_COMMUNITY): Payer: Medicare Other

## 2014-05-23 DIAGNOSIS — K5701 Diverticulitis of small intestine with perforation and abscess with bleeding: Secondary | ICD-10-CM

## 2014-05-23 LAB — URINE CULTURE
CULTURE: NO GROWTH
Colony Count: NO GROWTH

## 2014-05-23 LAB — LACTIC ACID, PLASMA
Lactic Acid, Venous: 2.3 mmol/L — ABNORMAL HIGH (ref 0.5–2.2)
Lactic Acid, Venous: 1.5 mmol/L (ref 0.5–2.2)

## 2014-05-23 LAB — CBC
HCT: 35.9 % — ABNORMAL LOW (ref 39.0–52.0)
HEMOGLOBIN: 11.8 g/dL — AB (ref 13.0–17.0)
MCH: 30.8 pg (ref 26.0–34.0)
MCHC: 32.9 g/dL (ref 30.0–36.0)
MCV: 93.7 fL (ref 78.0–100.0)
Platelets: 408 10*3/uL — ABNORMAL HIGH (ref 150–400)
RBC: 3.83 MIL/uL — AB (ref 4.22–5.81)
RDW: 14.2 % (ref 11.5–15.5)
WBC: 16.1 10*3/uL — ABNORMAL HIGH (ref 4.0–10.5)

## 2014-05-23 LAB — BASIC METABOLIC PANEL
Anion gap: 14 (ref 5–15)
BUN: 19 mg/dL (ref 6–23)
CALCIUM: 7.7 mg/dL — AB (ref 8.4–10.5)
CO2: 22 mEq/L (ref 19–32)
Chloride: 100 mEq/L (ref 96–112)
Creatinine, Ser: 1.4 mg/dL — ABNORMAL HIGH (ref 0.50–1.35)
GFR calc Af Amer: 53 mL/min — ABNORMAL LOW (ref >90)
GFR calc non Af Amer: 46 mL/min — ABNORMAL LOW (ref >90)
Glucose, Bld: 202 mg/dL — ABNORMAL HIGH (ref 70–99)
POTASSIUM: 4.6 meq/L (ref 3.7–5.3)
Sodium: 136 mEq/L — ABNORMAL LOW (ref 137–147)

## 2014-05-23 LAB — TRIGLYCERIDES: Triglycerides: 55 mg/dL (ref <150)

## 2014-05-23 MED ORDER — HYDROCORTISONE NA SUCCINATE PF 100 MG IJ SOLR
50.0000 mg | Freq: Two times a day (BID) | INTRAMUSCULAR | Status: DC
  Administered 2014-05-23 – 2014-05-24 (×2): 50 mg via INTRAVENOUS
  Filled 2014-05-23 (×4): qty 1

## 2014-05-23 MED ORDER — MORPHINE SULFATE 2 MG/ML IJ SOLN
1.0000 mg | INTRAMUSCULAR | Status: DC | PRN
  Administered 2014-05-23 (×2): 3 mg via INTRAVENOUS
  Administered 2014-05-23: 2 mg via INTRAVENOUS
  Administered 2014-05-23 – 2014-05-24 (×4): 3 mg via INTRAVENOUS
  Administered 2014-05-24: 2 mg via INTRAVENOUS
  Administered 2014-05-24: 3 mg via INTRAVENOUS
  Administered 2014-05-24 – 2014-05-25 (×7): 2 mg via INTRAVENOUS
  Filled 2014-05-23 (×3): qty 2
  Filled 2014-05-23 (×4): qty 1
  Filled 2014-05-23: qty 2
  Filled 2014-05-23: qty 1
  Filled 2014-05-23 (×3): qty 2
  Filled 2014-05-23 (×4): qty 1

## 2014-05-23 NOTE — Progress Notes (Signed)
PULMONARY / CRITICAL CARE MEDICINE   Name: [name] MRN: [id number] DOB: 04/02/1932    ADMISSION DATE:  05/22/2014 CONSULTATION DATE:  05/22/14  REFERRING MD :  CCS  CHIEF COMPLAINT:  Vent mgmt   INITIAL PRESENTATION: 80 yo male with hx HTN, myasthenia gravis, diverticulitis presented 11/21 with progressive abd pain and constipation.  CT findings c/w perforated diverticulitis and clinical picture concerning for worsening peritonitis.  He was taken to OR urgently for exp lap and small bowel resection.  Returned to ICU on vent post op and PCCM called to assist.   STUDIES:  CT abd/pelvis 11/21>>>Right lower quadrant inflammatory process most consistent with diverticulitis of the terminal ileum. There is mesenteric inflammation and micro perforation manifesting as several small extraluminal air bubbles. There is no drainable abscess or high-grade bowel obstruction.  SIGNIFICANT EVENTS: 11/21 admit, urgently to OR, exp lap, small bowel resection, to ICU on vent post op    SUBJECTIVE:  Mild abd pain.  No issues overnight. Looks great on PS 5/5.   VITAL SIGNS: Temp:  [97.6 F (36.4 C)-99.1 F (37.3 C)] 98.3 F (36.8 C) (11/22 0730) Pulse Rate:  [43-99] 99 (11/22 0900) Resp:  [14-26] 21 (11/22 0900) BP: (82-145)/(45-70) 145/70 mmHg (11/22 0900) SpO2:  [93 %-100 %] 94 % (11/22 0900) FiO2 (%):  [40 %-50 %] 40 % (11/22 0301) HEMODYNAMICS: CVP:  [7 mmHg-14 mmHg] 9 mmHg VENTILATOR SETTINGS: Vent Mode:  [-] PRVC FiO2 (%):  [40 %-50 %] 40 % Set Rate:  [16 bmp] 16 bmp Vt Set:  [600 mL] 600 mL PEEP:  [5 cmH20] 5 cmH20 Plateau Pressure:  [14 cmH20-15 cmH20] 15 cmH20 INTAKE / OUTPUT:  Intake/Output Summary (Last 24 hours) at 05/23/14 1004 Last data filed at 05/23/14 0941  Gross per 24 hour  Intake 3742.64 ml  Output   1285 ml  Net 2457.64 ml    PHYSICAL EXAMINATION: General:  Elderly male, NAD on vent  Neuro:  Awake, RASS 0, follows commands  HEENT:  Mm dry, ETT, NO  jvd Cardiovascular:  s1s2 rrr Lungs:  resps even, non labored on vent, Vt 1200ml on PS 5/5, few scattered rhonchi Abdomen:  Distended, -bs, midline incision c/d, large inguinal hernia  Musculoskeletal:  Warm and dry, no sig edema   LABS:  CBC  Recent Labs Lab 05/22/14 0047 05/22/14 1110 05/23/14 0331  WBC 22.7* 17.8* 16.1*  HGB 15.4 12.9* 11.8*  HCT 46.2 39.1 35.9*  PLT 611* 449* 408*   Coag's No results for input(s): APTT, INR in the last 168 hours. BMET  Recent Labs Lab 05/22/14 0047 05/22/14 1110 05/23/14 0331  NA 138 133* 136*  K 4.5 5.1 4.6  CL 95* 95* 100  CO2 24 24 22   BUN 15 16 19   CREATININE 1.36* 1.36* 1.40*  GLUCOSE 159* 236* 202*   Electrolytes  Recent Labs Lab 05/22/14 0047 05/22/14 1110 05/23/14 0331  CALCIUM 9.5 7.7* 7.7*   Sepsis Markers  Recent Labs Lab 05/22/14 1110 05/23/14 0040  LATICACIDVEN 2.4* 2.3*   ABG No results for input(s): PHART, PCO2ART, PO2ART in the last 168 hours. Liver Enzymes  Recent Labs Lab 05/22/14 0047  AST 23  ALT 22  ALKPHOS 72  BILITOT 1.6*  ALBUMIN 3.3*   Cardiac Enzymes No results for input(s): TROPONINI, PROBNP in the last 168 hours. Glucose  Recent Labs Lab 05/22/14 0733 05/22/14 1127  GLUCAP 151* 256*    Imaging Ct Abdomen Pelvis W Contrast  05/22/2014   CLINICAL DATA:  Low abdominal pain with urinary retention for 2 days. Initial encounter.  EXAM: CT ABDOMEN AND PELVIS WITH CONTRAST  TECHNIQUE: Multidetector CT imaging of the abdomen and pelvis was performed using the standard protocol following bolus administration of intravenous contrast.  CONTRAST:  OMNIPAQUE IOHEXOL 300 MG/ML  SOLN  COMPARISON:  Abdominal pelvic CT 09/01/2008.  FINDINGS: Lower chest: Mild chronic dependent atelectasis or scarring in both lung bases is similar to the prior examination. There is no confluent airspace opacity, pleural or pericardial effusion. A small hiatal hernia is noted.  Hepatobiliary: There is  a stable small cyst in the left hepatic lobe. Patient has undergone interval cholecystectomy. There is mild intra and extrahepatic biliary dilatation. The common bile duct measures up to 1.7 cm in diameter. The duct tapers distally. No intraductal calculus identified.  Pancreas: No evidence of pancreatic mass or pancreatic ductal dilatation. No surrounding inflammatory change.  Spleen: Stable appearance. There is a small low-density lesions centrally in the spleen which is unchanged.  Adrenals/Urinary Tract: Both adrenal glands appear normal.Bilateral renal cysts are stable. There is no hydronephrosis. Mild bladder wall thickening appears chronic and unchanged.  Stomach/Bowel: The stomach appears normal. There is a stable diverticulum of the proximal duodenum without surrounding inflammatory change. The mid small bowel is mildly distended. There is an inflammatory process in the right lower quadrant with infiltration of the mesenteric fat and several ill-defined extraluminal air bubbles. This process appears to arise from the terminal ileum which demonstrates wall thickening and several diverticula, most apparent on the coronal images. In conjunction with the prior examination, the appendix is visualized on axial image 73 and is normal in caliber. This is lateral to the epicenter of the inflammatory process. There is no colonic wall thickening. A portion of the sigmoid colon extends into a right inguinal hernia which has enlarged. However, there is no evidence of incarceration or bowel obstruction.No ascites or focal extraluminal fluid collection.  Vascular/Lymphatic: There are no enlarged abdominal or pelvic lymph nodes. There is stable atherosclerosis of the aorta, its branches and the iliac arteries.  Reproductive: The prostate gland is moderately enlarged with nodularity protruding into the bladder base, similar to the prior examination.  Other: As above, there is an enlarging right inguinal hernia containing  sigmoid colon. There is no evidence of incarceration or bowel obstruction. No other abdominal wall hernia is demonstrated.  Musculoskeletal: No acute or significant osseous findings.  IMPRESSION: 1. Right lower quadrant inflammatory process most consistent with diverticulitis of the terminal ileum. There is mesenteric inflammation and micro perforation manifesting as several small extraluminal air bubbles. There is no drainable abscess or high-grade bowel obstruction. 2. Enlarging right inguinal hernia containing sigmoid colon. No evidence of incarceration. 3. Mildly progressive intra and extrahepatic biliary dilatation status post interval cholecystectomy. Total bilirubin appears only mildly elevated. Follow up of liver functions studies recommended. 4. Stable enlargement of the prostate gland. 5. No significant adenopathy demonstrated. 6. These results were called by telephone at the time of interpretation on 05/22/2014 at 2:50 am to Dr. Loren Racer , who verbally acknowledged these results.   Electronically Signed   By: Roxy Horseman M.D.   On: 05/22/2014 02:50   Dg Chest Port 1 View  05/22/2014   CLINICAL DATA:  Intubation and nasogastric tube placement.  EXAM: PORTABLE CHEST - 1 VIEW  COMPARISON:  05/22/2014 earlier.  FINDINGS: There has been placement of an endotracheal tube with tip 4.1 cm above the carina. Nasogastric tube is been placed with  tip in the region of the gastroesophageal junction and side-port over the distal esophagus. The nasogastric tube could be advanced another 8-10 cm. Left subclavian central venous catheter is present with tip obliquely oriented over the region of the SVC.  Lungs are adequately inflated with mild stable elevation of the right hemidiaphragm. No evidence of pneumothorax. There is continued opacification over the left base which may be due to atelectasis or infection. Cannot exclude a small amount of left pleural fluid. There is stable cardiomegaly. Remainder of the  exam is unchanged.  IMPRESSION: Continued left base opacification without significant change which may be due to atelectasis and effusion, although cannot exclude infection.  Tubes and lines as described. Note that the nasogastric tube could be advanced another 8-10 cm.   Electronically Signed   By: Elberta Fortisaniel  Boyle M.D.   On: 05/22/2014 08:45   Dg Chest Port 1 View  05/22/2014   CLINICAL DATA:  Progressive generalized abdominal pain for 1 day. Loose stool. No nausea or vomiting. Initial encounter.  EXAM: PORTABLE CHEST - 1 VIEW  COMPARISON:  09/01/2008.  FINDINGS: 0143 hr. There are significantly lower lung volumes with increased patchy bibasilar opacities most consistent with atelectasis. The heart size and mediastinal contours are stable for the degree of inspiration. No pneumothorax or significant pleural effusion identified. The bones appear unremarkable. Multiple telemetry leads overlie the chest.  IMPRESSION: Significantly lower lung volumes with associated bibasilar atelectasis. No suspected acute chest findings.   Electronically Signed   By: Roxy HorsemanBill  Veazey M.D.   On: 05/22/2014 01:55   Dg Abd Portable 1v  05/22/2014   CLINICAL DATA:  NG tube placement.  EXAM: PORTABLE ABDOMEN - 1 VIEW  COMPARISON:  CT abdomen and pelvis 05/22/2014  FINDINGS: Enteric tube is present with tip and side hole projecting over the gastric body. Right upper quadrant abdominal surgical clips are present. Residual oral contrast is present in distal small bowel, with evidence of luminal narrowing and fold thickening involving small bowel in the right lower quadrant corresponding to wall thickening and inflammation seen on recent CT. Gas is present in nondilated colon. There is no evidence of bowel obstruction. Small calcifications in the pelvis likely represent phleboliths.  IMPRESSION: NG tube terminating over the stomach.   Electronically Signed   By: Sebastian AcheAllen  Grady   On: 05/22/2014 11:44     ASSESSMENT /  PLAN:  PULMONARY OETT 11/21>>>11/22 Acute respiratory failure - post op abd surgery  P:   Extubate  F/u CXR in am  pulm hygiene  PRN BD  OOB when ok with surgery   CARDIOVASCULAR CVL [place] CVL 11/21>>> Hypotension - mild  Hx HTN Sepsis - abd  P:  Hold home norvasc, lasix, ARB  Trend lactate  Gentle volume  Wean stress steroids - back to chronic pred (10mg ) 11/23  RENAL AKI - mild.  Baseline Scr ~1.1 Decreased UOP Hyponatremia  P:   Cont gentle volume  F/u chem at noon and in am  F/u lactate, ABG  Strict i/o   GASTROINTESTINAL Perforated small bowel diverticulitis - s/p exp lap with bowel resection 11/21 Peritonitis  progressive intra and extrahepatic biliary dilatation - hx cholecystectomy.  Bili only mildly elevated.  P:   Mgmt per surgery  NPO  NG to suction  PPI  F/u LFT's   HEMATOLOGIC No active issue  P:  F/u cbc  SQ heparin   INFECTIOUS Peritonitis  P:   BCx2 11/21>>> UC 11/21>>> Cipro 11/21>>> Flagyl 11/21>>>  ENDOCRINE  Relative adrenal insufficiency - chronic prednisone (?r/t MG)  Hyperglycemia  P:   Monitor glucose on chem  Add ssi  solucortef   NEUROLOGIC Pain mgmt  P:   PRN fentanyl   FAMILY  - Updates: no family available 11/22  Will ask surgery if they would like triad assist with medical mgmt.   Dirk Dress, NP 05/23/2014  10:04 AM Pager: [phone] or [phone]  Ready for extubation, weaning well.  Extubate.  Titrate O2 for sats.  Swallow evaluation.  Hold in ICU overnight for observation then may transfer in AM.  My CC time 35 min independent of extender's time.   Patient seen and examined, agree with above note. I dictated the care and orders written for this patient under my direction.   Alyson Reedy, MD  [phone]

## 2014-05-23 NOTE — Progress Notes (Signed)
Utilization Review Completed.   Georg Ang, RN, BSN Nurse Case Manager  

## 2014-05-23 NOTE — Plan of Care (Signed)
Problem: Phase I Progression Outcomes Goal: Pain controlled with appropriate interventions Outcome: Progressing Goal: Incision/dressings dry and intact Outcome: Progressing Goal: Sutures/staples intact Outcome: Progressing Goal: Voiding-avoid urinary catheter unless indicated Outcome: Progressing

## 2014-05-23 NOTE — Procedures (Signed)
Extubation Procedure Note  Patient Details:   Name: [name] DOB: 09/15/1931 MRN: [id number]   Airway Documentation:     Evaluation  O2 sats: stable throughout Complications: No apparent complications Patient did tolerate procedure well. Bilateral Breath Sounds: Clear, Diminished Suctioning: Airway, Oral Yes  Patient extubated to 4lnc. Vital signs stable at this time. No complications. Patient tolerated well. RN at bedside. RT will continue to monitor.  [name], [name] 05/23/2014, 8:40 AM

## 2014-05-23 NOTE — Progress Notes (Signed)
1 Day Post-Op  Subjective: Just extubated. uop 25cc/hr; cvp 9; no complaints  Objective: Vital signs in last 24 hours: Temp:  [97.6 F (36.4 C)-99.1 F (37.3 C)] 98.3 F (36.8 C) (11/22 0730) Pulse Rate:  [43-93] 69 (11/22 0800) Resp:  [14-26] 16 (11/22 0800) BP: (82-131)/(45-62) 123/58 mmHg (11/22 0800) SpO2:  [93 %-100 %] 93 % (11/22 0842) FiO2 (%):  [40 %-50 %] 40 % (11/22 0301) Last BM Date:  (PTA)  Intake/Output from previous day: 11/21 0701 - 11/22 0700 In: 3955.9 [I.V.:3195.9; NG/GT:60; IV Piggyback:700] Out: 1585 [Urine:1235; Emesis/NG output:350] Intake/Output this shift:    Awake, alert, looks great! Dec BS at bases Reg Obese, soft, distended; stable large RIH; open wound No edema; +SCDs  Lab Results:   Recent Labs  05/22/14 1110 05/23/14 0331  WBC 17.8* 16.1*  HGB 12.9* 11.8*  HCT 39.1 35.9*  PLT 449* 408*   BMET  Recent Labs  05/22/14 1110 05/23/14 0331  NA 133* 136*  K 5.1 4.6  CL 95* 100  CO2 24 22  GLUCOSE 236* 202*  BUN 16 19  CREATININE 1.36* 1.40*  CALCIUM 7.7* 7.7*   PT/INR No results for input(s): LABPROT, INR in the last 72 hours. ABG No results for input(s): PHART, HCO3 in the last 72 hours.  Invalid input(s): PCO2, PO2  Studies/Results: Ct Abdomen Pelvis W Contrast  05/22/2014   CLINICAL DATA:  Low abdominal pain with urinary retention for 2 days. Initial encounter.  EXAM: CT ABDOMEN AND PELVIS WITH CONTRAST  TECHNIQUE: Multidetector CT imaging of the abdomen and pelvis was performed using the standard protocol following bolus administration of intravenous contrast.  CONTRAST:  100mL OMNIPAQUE IOHEXOL 300 MG/ML  SOLN  COMPARISON:  Abdominal pelvic CT 09/01/2008.  FINDINGS: Lower chest: Mild chronic dependent atelectasis or scarring in both lung bases is similar to the prior examination. There is no confluent airspace opacity, pleural or pericardial effusion. A small hiatal hernia is noted.  Hepatobiliary: There is a stable small  cyst in the left hepatic lobe. Patient has undergone interval cholecystectomy. There is mild intra and extrahepatic biliary dilatation. The common bile duct measures up to 1.7 cm in diameter. The duct tapers distally. No intraductal calculus identified.  Pancreas: No evidence of pancreatic mass or pancreatic ductal dilatation. No surrounding inflammatory change.  Spleen: Stable appearance. There is a small low-density lesions centrally in the spleen which is unchanged.  Adrenals/Urinary Tract: Both adrenal glands appear normal.Bilateral renal cysts are stable. There is no hydronephrosis. Mild bladder wall thickening appears chronic and unchanged.  Stomach/Bowel: The stomach appears normal. There is a stable diverticulum of the proximal duodenum without surrounding inflammatory change. The mid small bowel is mildly distended. There is an inflammatory process in the right lower quadrant with infiltration of the mesenteric fat and several ill-defined extraluminal air bubbles. This process appears to arise from the terminal ileum which demonstrates wall thickening and several diverticula, most apparent on the coronal images. In conjunction with the prior examination, the appendix is visualized on axial image 73 and is normal in caliber. This is lateral to the epicenter of the inflammatory process. There is no colonic wall thickening. A portion of the sigmoid colon extends into a right inguinal hernia which has enlarged. However, there is no evidence of incarceration or bowel obstruction.No ascites or focal extraluminal fluid collection.  Vascular/Lymphatic: There are no enlarged abdominal or pelvic lymph nodes. There is stable atherosclerosis of the aorta, its branches and the iliac arteries.  Reproductive:  The prostate gland is moderately enlarged with nodularity protruding into the bladder base, similar to the prior examination.  Other: As above, there is an enlarging right inguinal hernia containing sigmoid colon.  There is no evidence of incarceration or bowel obstruction. No other abdominal wall hernia is demonstrated.  Musculoskeletal: No acute or significant osseous findings.  IMPRESSION: 1. Right lower quadrant inflammatory process most consistent with diverticulitis of the terminal ileum. There is mesenteric inflammation and micro perforation manifesting as several small extraluminal air bubbles. There is no drainable abscess or high-grade bowel obstruction. 2. Enlarging right inguinal hernia containing sigmoid colon. No evidence of incarceration. 3. Mildly progressive intra and extrahepatic biliary dilatation status post interval cholecystectomy. Total bilirubin appears only mildly elevated. Follow up of liver functions studies recommended. 4. Stable enlargement of the prostate gland. 5. No significant adenopathy demonstrated. 6. These results were called by telephone at the time of interpretation on 05/22/2014 at 2:50 am to Dr. Loren Racer , who verbally acknowledged these results.   Electronically Signed   By: Roxy Horseman M.D.   On: 05/22/2014 02:50   Dg Chest Portable 1 View  05/23/2014   CLINICAL DATA:  Acute respiratory failure, history myasthenia gravis, hypertension, former smoker  EXAM: PORTABLE CHEST - 1 VIEW  COMPARISON:  Portable exam 0550 hr compared 05/22/2014  FINDINGS: Tip of endotracheal tube projects 5.5 cm above carina.  Nasogastric tube extends into stomach.  LEFT subclavian central venous catheter tip projects over SVC.  Enlargement of cardiac silhouette.  Mediastinal contours and pulmonary vascularity normal.  Chronic elevation of RIGHT diaphragm.  Minimal RIGHT basilar atelectasis with more pronounced atelectasis versus consolidation in LEFT lower lobe.  No pleural effusion or pneumothorax.  IMPRESSION: Minimal RIGHT basilar atelectasis with persistent atelectasis versus consolidation in LEFT lower lobe.  Enlargement of cardiac silhouette.   Electronically Signed   By: Ulyses Southward M.D.    On: 05/23/2014 08:14   Dg Chest Port 1 View  05/22/2014   CLINICAL DATA:  Intubation and nasogastric tube placement.  EXAM: PORTABLE CHEST - 1 VIEW  COMPARISON:  05/22/2014 earlier.  FINDINGS: There has been placement of an endotracheal tube with tip 4.1 cm above the carina. Nasogastric tube is been placed with tip in the region of the gastroesophageal junction and side-port over the distal esophagus. The nasogastric tube could be advanced another 8-10 cm. Left subclavian central venous catheter is present with tip obliquely oriented over the region of the SVC.  Lungs are adequately inflated with mild stable elevation of the right hemidiaphragm. No evidence of pneumothorax. There is continued opacification over the left base which may be due to atelectasis or infection. Cannot exclude a small amount of left pleural fluid. There is stable cardiomegaly. Remainder of the exam is unchanged.  IMPRESSION: Continued left base opacification without significant change which may be due to atelectasis and effusion, although cannot exclude infection.  Tubes and lines as described. Note that the nasogastric tube could be advanced another 8-10 cm.   Electronically Signed   By: Elberta Fortis M.D.   On: 05/22/2014 08:45   Dg Chest Port 1 View  05/22/2014   CLINICAL DATA:  Progressive generalized abdominal pain for 1 day. Loose stool. No nausea or vomiting. Initial encounter.  EXAM: PORTABLE CHEST - 1 VIEW  COMPARISON:  09/01/2008.  FINDINGS: 0143 hr. There are significantly lower lung volumes with increased patchy bibasilar opacities most consistent with atelectasis. The heart size and mediastinal contours are stable for the degree  of inspiration. No pneumothorax or significant pleural effusion identified. The bones appear unremarkable. Multiple telemetry leads overlie the chest.  IMPRESSION: Significantly lower lung volumes with associated bibasilar atelectasis. No suspected acute chest findings.   Electronically Signed    By: Roxy HorsemanBill  Veazey M.D.   On: 05/22/2014 01:55   Dg Abd Portable 1v  05/22/2014   CLINICAL DATA:  NG tube placement.  EXAM: PORTABLE ABDOMEN - 1 VIEW  COMPARISON:  CT abdomen and pelvis 05/22/2014  FINDINGS: Enteric tube is present with tip and side hole projecting over the gastric body. Right upper quadrant abdominal surgical clips are present. Residual oral contrast is present in distal small bowel, with evidence of luminal narrowing and fold thickening involving small bowel in the right lower quadrant corresponding to wall thickening and inflammation seen on recent CT. Gas is present in nondilated colon. There is no evidence of bowel obstruction. Small calcifications in the pelvis likely represent phleboliths.  IMPRESSION: NG tube terminating over the stomach.   Electronically Signed   By: Sebastian AcheAllen  Grady   On: 05/22/2014 11:44    Anti-infectives: Anti-infectives    Start     Dose/Rate Route Frequency Ordered Stop   05/22/14 0900  ciprofloxacin (CIPRO) IVPB 400 mg     400 mg200 mL/hr over 60 Minutes Intravenous Every 12 hours 05/22/14 0821 05/29/14 0859   05/22/14 0900  metroNIDAZOLE (FLAGYL) IVPB 500 mg     500 mg100 mL/hr over 60 Minutes Intravenous Every 8 hours 05/22/14 0821 05/29/14 0859   05/22/14 0300  ciprofloxacin (CIPRO) IVPB 400 mg     400 mg200 mL/hr over 60 Minutes Intravenous  Once 05/22/14 0253 05/22/14 0434   05/22/14 0300  metroNIDAZOLE (FLAGYL) IVPB 500 mg     500 mg100 mL/hr over 60 Minutes Intravenous  Once 05/22/14 0253 05/22/14 0434      Assessment/Plan: s/p Procedure(s): EXPLORATORY LAPAROTOMY (N/A) SMALL BOWEL RESECTION (N/A)  Aggressive pulm toilet - IS, flutter valve Cont bowel rest, NG to LIWS Cont IV abx for SB perforation Elevated Cr - cr stable; uop ok for me; cvp 9; doesn't appear under-resuscitated Start W-D dressing bid to midline Cont steroids Prn IV pain meds  Appreciate CCM help Mary SellaEric M. Andrey CampanileWilson, MD, FACS General, Bariatric, & Minimally Invasive  Sur[facility] [place]   LOS: 1 day    Atilano InaWILSON,Tynleigh Birt M 05/23/2014

## 2014-05-24 LAB — CBC
HEMATOCRIT: 36.7 % — AB (ref 39.0–52.0)
Hemoglobin: 11.9 g/dL — ABNORMAL LOW (ref 13.0–17.0)
MCH: 31.6 pg (ref 26.0–34.0)
MCHC: 32.4 g/dL (ref 30.0–36.0)
MCV: 97.3 fL (ref 78.0–100.0)
PLATELETS: 490 10*3/uL — AB (ref 150–400)
RBC: 3.77 MIL/uL — AB (ref 4.22–5.81)
RDW: 14.2 % (ref 11.5–15.5)
WBC: 19.2 10*3/uL — ABNORMAL HIGH (ref 4.0–10.5)

## 2014-05-24 LAB — BASIC METABOLIC PANEL
ANION GAP: 12 (ref 5–15)
BUN: 20 mg/dL (ref 6–23)
CALCIUM: 7.5 mg/dL — AB (ref 8.4–10.5)
CO2: 24 mEq/L (ref 19–32)
Chloride: 105 mEq/L (ref 96–112)
Creatinine, Ser: 1.2 mg/dL (ref 0.50–1.35)
GFR calc Af Amer: 64 mL/min — ABNORMAL LOW (ref >90)
GFR calc non Af Amer: 55 mL/min — ABNORMAL LOW (ref >90)
Glucose, Bld: 145 mg/dL — ABNORMAL HIGH (ref 70–99)
Potassium: 4.4 mEq/L (ref 3.7–5.3)
Sodium: 141 mEq/L (ref 137–147)

## 2014-05-24 MED ORDER — HYDROCORTISONE NA SUCCINATE PF 100 MG IJ SOLR
25.0000 mg | Freq: Two times a day (BID) | INTRAMUSCULAR | Status: DC
Start: 2014-05-24 — End: 2014-05-30
  Administered 2014-05-24 – 2014-05-29 (×11): 25 mg via INTRAVENOUS
  Filled 2014-05-24 (×6): qty 0.5
  Filled 2014-05-24: qty 2
  Filled 2014-05-24 (×8): qty 0.5

## 2014-05-24 NOTE — Progress Notes (Signed)
CCS on call surgeon paged and made aware that pt is in Afib with RVR. Pt HR maintains 90-110 with spikes into the 130's. A 12 lead EKG done showing Afib with RVR. Pt is not complaining of pain and he appears comfortable in bed. No new orders received at this time, will continue to monitor.

## 2014-05-24 NOTE — Progress Notes (Signed)
Patient ID: [name], male   DOB: 10/12/1931, 80 y.o.   MRN: [id number] 2 Days Post-Op  Subjective: Pt feels well today.  Up in chair.  On 4L [place].  NGT wall mount not working.  No flatus yet.  Objective: Vital signs in last 24 hours: Temp:  [97.6 F (36.4 C)-98.3 F (36.8 C)] 97.6 F (36.4 C) (11/23 0800) Pulse Rate:  [41-105] 93 (11/23 0800) Resp:  [12-23] 23 (11/23 0800) BP: (103-137)/(40-81) 128/61 mmHg (11/23 0800) SpO2:  [90 %-100 %] 95 % (11/23 0800) Last BM Date:  (PTA)  Intake/Output from previous day: 11/22 0701 - 11/23 0700 In: 3025 [I.V.:2325; IV Piggyback:700] Out: 1300 [Urine:1250; Emesis/NG output:50] Intake/Output this shift:    PE: Abd: soft, absent BS, wound is clean and packed.  NGT with minimal output, but not working currently Heart: regular with some PVCs Lungs: CTAB, but on O2  Lab Results:   Recent Labs  05/23/14 0331 05/24/14 0248  WBC 16.1* 19.2*  HGB 11.8* 11.9*  HCT 35.9* 36.7*  PLT 408* 490*   BMET  Recent Labs  05/23/14 0331 05/24/14 0248  NA 136* 141  K 4.6 4.4  CL 100 105  CO2 22 24  GLUCOSE 202* 145*  BUN 19 20  CREATININE 1.40* 1.20  CALCIUM 7.7* 7.5*   PT/INR No results for input(s): LABPROT, INR in the last 72 hours. CMP     Component Value Date/Time   NA 141 05/24/2014 0248   K 4.4 05/24/2014 0248   CL 105 05/24/2014 0248   CO2 24 05/24/2014 0248   GLUCOSE 145* 05/24/2014 0248   BUN 20 05/24/2014 0248   CREATININE 1.20 05/24/2014 0248   CALCIUM 7.5* 05/24/2014 0248   PROT 6.9 05/22/2014 0047   ALBUMIN 3.3* 05/22/2014 0047   AST 23 05/22/2014 0047   ALT 22 05/22/2014 0047   ALKPHOS 72 05/22/2014 0047   BILITOT 1.6* 05/22/2014 0047   GFRNONAA 55* 05/24/2014 0248   GFRAA 64* 05/24/2014 0248   Lipase     Component Value Date/Time   LIPASE 52 05/22/2014 0047       Studies/Results: Dg Chest Portable 1 View  05/23/2014   CLINICAL DATA:  Acute respiratory failure, history myasthenia gravis,  hypertension, former smoker  EXAM: PORTABLE CHEST - 1 VIEW  COMPARISON:  Portable exam 0550 hr compared 05/22/2014  FINDINGS: Tip of endotracheal tube projects 5.5 cm above carina.  Nasogastric tube extends into stomach.  LEFT subclavian central venous catheter tip projects over SVC.  Enlargement of cardiac silhouette.  Mediastinal contours and pulmonary vascularity normal.  Chronic elevation of RIGHT diaphragm.  Minimal RIGHT basilar atelectasis with more pronounced atelectasis versus consolidation in LEFT lower lobe.  No pleural effusion or pneumothorax.  IMPRESSION: Minimal RIGHT basilar atelectasis with persistent atelectasis versus consolidation in LEFT lower lobe.  Enlargement of cardiac silhouette.   Electronically Signed   By: Ulyses SouthwardMark  Boles M.D.   On: 05/23/2014 08:14   Dg Abd Portable 1v  05/22/2014   CLINICAL DATA:  NG tube placement.  EXAM: PORTABLE ABDOMEN - 1 VIEW  COMPARISON:  CT abdomen and pelvis 05/22/2014  FINDINGS: Enteric tube is present with tip and side hole projecting over the gastric body. Right upper quadrant abdominal surgical clips are present. Residual oral contrast is present in distal small bowel, with evidence of luminal narrowing and fold thickening involving small bowel in the right lower quadrant corresponding to wall thickening and inflammation seen on recent CT. Gas is present in  nondilated colon. There is no evidence of bowel obstruction. Small calcifications in the pelvis likely represent phleboliths.  IMPRESSION: NG tube terminating over the stomach.   Electronically Signed   By: Sebastian AcheAllen  Grady   On: 05/22/2014 11:44    Anti-infectives: Anti-infectives    Start     Dose/Rate Route Frequency Ordered Stop   05/22/14 0900  ciprofloxacin (CIPRO) IVPB 400 mg     400 mg200 mL/hr over 60 Minutes Intravenous Every 12 hours 05/22/14 0821 05/29/14 0859   05/22/14 0900  metroNIDAZOLE (FLAGYL) IVPB 500 mg     500 mg100 mL/hr over 60 Minutes Intravenous Every 8 hours 05/22/14 0821  05/29/14 0859   05/22/14 0300  ciprofloxacin (CIPRO) IVPB 400 mg     400 mg200 mL/hr over 60 Minutes Intravenous  Once 05/22/14 0253 05/22/14 0434   05/22/14 0300  metroNIDAZOLE (FLAGYL) IVPB 500 mg     500 mg100 mL/hr over 60 Minutes Intravenous  Once 05/22/14 0253 05/22/14 0434       Assessment/Plan  1. POD 2, s/p ex lap with SBR for diverticulitis of small bowel 2. ARF, improving, but on O2  3. AKI, resolved 4. Myasthenia gravis 5. Was on plavix 6. Leukocytosis, up to 19  Plan: 1. Patient looks well enough to transfer to SDU 2. CCM suggested triad take over medical management.  I agree with that and appreciate this assistance 3. Will tx to SDU today 4. Pt eval and treatment 5. Cont abx therapy 6. Steroids per medicine service 7. May be able to resume plavix soon when able to take oral meds 8. Cont NGT until ileus resolves 9. Follow WBC  LOS: 2 days    Zlata Alcaide E 05/24/2014, 9:22 AM Pager: [phone]

## 2014-05-24 NOTE — Progress Notes (Signed)
Pt up in chair, noted scrotal swelling and pt states feels swollen. Pt voided 125cc amber urine.  Assisted back to bed for rest and bladder scan pt. Scanned for only 125cc. Wall suction functioning intermittently as ordered. (new gauge set up earlier today which was working and changed to a third IWS gauge at this time, pt NGT was irrigated then and irrigated now with return of flush in NGT. NGT vent port checked and flushed as well.  All functioning. Pt in bed resting. Continue to monitor output.

## 2014-05-25 ENCOUNTER — Encounter (HOSPITAL_COMMUNITY): Payer: Self-pay | Admitting: General Surgery

## 2014-05-25 DIAGNOSIS — I480 Paroxysmal atrial fibrillation: Secondary | ICD-10-CM

## 2014-05-25 DIAGNOSIS — I517 Cardiomegaly: Secondary | ICD-10-CM

## 2014-05-25 LAB — CBC
HCT: 36.8 % — ABNORMAL LOW (ref 39.0–52.0)
Hemoglobin: 11.9 g/dL — ABNORMAL LOW (ref 13.0–17.0)
MCH: 31.3 pg (ref 26.0–34.0)
MCHC: 32.3 g/dL (ref 30.0–36.0)
MCV: 96.8 fL (ref 78.0–100.0)
Platelets: 433 10*3/uL — ABNORMAL HIGH (ref 150–400)
RBC: 3.8 MIL/uL — ABNORMAL LOW (ref 4.22–5.81)
RDW: 14.4 % (ref 11.5–15.5)
WBC: 19.5 10*3/uL — ABNORMAL HIGH (ref 4.0–10.5)

## 2014-05-25 LAB — BASIC METABOLIC PANEL
Anion gap: 11 (ref 5–15)
BUN: 19 mg/dL (ref 6–23)
CALCIUM: 7.4 mg/dL — AB (ref 8.4–10.5)
CO2: 24 mEq/L (ref 19–32)
CREATININE: 1.14 mg/dL (ref 0.50–1.35)
Chloride: 105 mEq/L (ref 96–112)
GFR, EST AFRICAN AMERICAN: 68 mL/min — AB (ref >90)
GFR, EST NON AFRICAN AMERICAN: 59 mL/min — AB (ref >90)
GLUCOSE: 160 mg/dL — AB (ref 70–99)
POTASSIUM: 4.2 meq/L (ref 3.7–5.3)
Sodium: 140 mEq/L (ref 137–147)

## 2014-05-25 LAB — GLUCOSE, CAPILLARY: Glucose-Capillary: 245 mg/dL — ABNORMAL HIGH (ref 70–99)

## 2014-05-25 MED ORDER — DILTIAZEM LOAD VIA INFUSION
10.0000 mg | Freq: Once | INTRAVENOUS | Status: AC
  Administered 2014-05-25: 10 mg via INTRAVENOUS
  Filled 2014-05-25: qty 10

## 2014-05-25 MED ORDER — DILTIAZEM HCL 100 MG IV SOLR
5.0000 mg/h | INTRAVENOUS | Status: AC
  Administered 2014-05-25: 15 mg/h via INTRAVENOUS
  Administered 2014-05-25: 10 mg/h via INTRAVENOUS
  Administered 2014-05-25: 5 mg/h via INTRAVENOUS
  Administered 2014-05-26 – 2014-05-27 (×3): 10 mg/h via INTRAVENOUS
  Filled 2014-05-25 (×5): qty 100

## 2014-05-25 NOTE — Progress Notes (Signed)
3 Days Post-Op  Subjective: Pt doing well this AM.  No flatus/BM  Objective: Vital signs in last 24 hours: Temp:  [97.6 F (36.4 C)-99.2 F (37.3 C)] 98.3 F (36.8 C) (11/24 0403) Pulse Rate:  [45-130] 130 (11/24 0600) Resp:  [14-25] 23 (11/24 0600) BP: (112-152)/(46-93) 152/70 mmHg (11/24 0600) SpO2:  [90 %-98 %] 92 % (11/24 0600) Last BM Date:  (PTA)  Intake/Output from previous day: 11/23 0701 - 11/24 0700 In: 2750 [I.V.:2000; NG/GT:50; IV Piggyback:700] Out: 1000 [Urine:900; Emesis/NG output:100] Intake/Output this shift:    General appearance: alert and cooperative Resp: clear to auscultation bilaterally Cardio: irreg irreg rhythm GI: s/nd/ approp ttp, active BS Incision/Wound: wound c/d/i  Lab Results:   Recent Labs  05/24/14 0248 05/25/14 0012  WBC 19.2* 19.5*  HGB 11.9* 11.9*  HCT 36.7* 36.8*  PLT 490* 433*   BMET  Recent Labs  05/24/14 0248 05/25/14 0012  NA 141 140  K 4.4 4.2  CL 105 105  CO2 24 24  GLUCOSE 145* 160*  BUN 20 19  CREATININE 1.20 1.14  CALCIUM 7.5* 7.4*    Studies/Results: No results found.  Anti-infectives: Anti-infectives    Start     Dose/Rate Route Frequency Ordered Stop   05/22/14 0900  ciprofloxacin (CIPRO) IVPB 400 mg     400 mg200 mL/hr over 60 Minutes Intravenous Every 12 hours 05/22/14 0821 05/29/14 0859   05/22/14 0900  metroNIDAZOLE (FLAGYL) IVPB 500 mg     500 mg100 mL/hr over 60 Minutes Intravenous Every 8 hours 05/22/14 0821 05/29/14 0859   05/22/14 0300  ciprofloxacin (CIPRO) IVPB 400 mg     400 mg200 mL/hr over 60 Minutes Intravenous  Once 05/22/14 0253 05/22/14 0434   05/22/14 0300  metroNIDAZOLE (FLAGYL) IVPB 500 mg     500 mg100 mL/hr over 60 Minutes Intravenous  Once 05/22/14 0253 05/22/14 0434      Assessment/Plan: s/p Procedure(s): EXPLORATORY LAPAROTOMY (N/A) SMALL BOWEL RESECTION (N/A) con't NGt  Con't abx Consult Cardiology for new onset  Afib Mobilize Awaiting bowel function Maybe  clamp NGT tomorrow.  LOS: 3 days    Marigene EhlersRamirez Jr., [facility] 05/25/2014

## 2014-05-25 NOTE — Progress Notes (Signed)
NUTRITION FOLLOW UP  Intervention:    Diet advancement as able per MD, recommend regular diet with Ensure Complete po BID, each supplement provides 350 kcal and 13 grams of protein.  Nutrition Dx:   Inadequate oral intake related to inability to eat as evidenced by NPO status, ongoing.  Goal:   Intake to meet >90% of estimated nutrition needs, unmet.  Monitor:   Diet advancement, PO intake, labs, weight trend.  Assessment:   80 yo male with hx HTN, myasthenia gravis, diverticulitis presented 11/21 with progressive abd pain and constipation. CT findings c/w perforated diverticulitis and clinical picture concerning for worsening peritonitis. He was taken to OR urgently for exp lap and small bowel resection. Returned to ICU on vent post op and PCCM called to assist.   Patient was extubated on 11/22. Surgery team following; awaiting return of bowel function to start advancing diet. Remains NPO at this time. NGT in place for suction, may be able to clamp it tomorrow.   Height: Ht Readings from Last 1 Encounters:  05/23/14 5\' 8"  (1.727 m)    Weight Status:   Wt Readings from Last 1 Encounters:  05/22/14 220 lb 7.4 oz (100 kg)    Re-estimated needs:  Kcal: 1900-2100 Protein: 100-110 gm Fluid: >/= 2 L  Skin: abdominal incision  Diet Order: Diet NPO time specified   Intake/Output Summary (Last 24 hours) at 05/25/14 1422 Last data filed at 05/25/14 1300  Gross per 24 hour  Intake   3020 ml  Output   1100 ml  Net   1920 ml    Last BM: PTA   Labs:   Recent Labs Lab 05/23/14 0331 05/24/14 0248 05/25/14 0012  NA 136* 141 140  K 4.6 4.4 4.2  CL 100 105 105  CO2 22 24 24   BUN 19 20 19   CREATININE 1.40* 1.20 1.14  CALCIUM 7.7* 7.5* 7.4*  GLUCOSE 202* 145* 160*    CBG (last 3)  No results for input(s): GLUCAP in the last 72 hours.  Scheduled Meds: . antiseptic oral rinse  7 mL Mouth Rinse QID  . chlorhexidine  15 mL Mouth Rinse BID  . ciprofloxacin  400 mg  Intravenous Q12H  . heparin  5,000 Units Subcutaneous 3 times per day  . hydrocortisone sod succinate (SOLU-CORTEF) inj  25 mg Intravenous Q12H  . metronidazole  500 mg Intravenous Q8H    Continuous Infusions: . sodium chloride 100 mL/hr at 05/25/14 0845  . diltiazem (CARDIZEM) infusion 12.5 mg/hr (05/25/14 1300)     Joaquin CourtsKimberly Claudina Oliphant, RD, LDN, CNSC Pager [phone] After Hours Pager [phone]

## 2014-05-25 NOTE — Consult Note (Signed)
Reason for Consult: New Onset AFIB  Requesting Physician: CCS  HPI: Pt is an 80 y/o married WM, father of 3 girls, retired Veterinary surgeonheriff in [place] admitted 11/21 with abd pain now POD #2 exploratory lap and SB resection. No prior cardiac Hx.H/O Rx HTN. We were asked to see for new onset AFIB with RVR. Pt is asymptomatic and hemodynamically stable.   Problem List: Patient Active Problem List   Diagnosis Date Noted  . Diverticulitis of small intestine with perforation with bleeding 05/22/2014  . Cellulitis of hand 12/13/2011  . 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 HTN (hypertension) 12/13/2011    PMHx:  Past Medical History  Diagnosis Date  . Hypertension   . Myasthenia gravis   . Cellulitis   . Diverticula of small intestine    Past Surgical History  Procedure Laterality Date  . Eye surgery    . Abdominal surgery    . Cholecystectomy    . Small intestine surgery  2006    perf SB diverticulitis    FAMHx: Family History  Problem Relation Age of Onset  . Heart failure Father     SOCHx:  reports that he has quit smoking. He has never used smokeless tobacco. He reports that he does not drink alcohol or use illicit drugs.  ALLERGIES: No Known Allergies  ROS: A comprehensive review of systems was negative.  HOME MEDICATIONS: Prescriptions prior to admission  Medication Sig Dispense Refill Last Dose  . alendronate (FOSAMAX) 70 MG tablet Take 70 mg by mouth every Sunday. Take with a full glass of water on an empty stomach.   Past Week at Unknown time  . amLODipine (NORVASC) 5 MG tablet Take 5 mg by mouth daily.   Past Week at Unknown time  . brimonidine-timolol (COMBIGAN) 0.2-0.5 % ophthalmic solution Place 1 drop into the left eye every 12 (twelve) hours.   Past Week at Unknown time  . furosemide (LASIX) 20 MG tablet Take 40 mg by mouth daily.    Past Week at Unknown time  . irbesartan (AVAPRO) 150 MG tablet Take 150 mg by mouth at bedtime.   Past Week at Unknown  time  . OVER THE COUNTER MEDICATION Take 1 tablet by mouth every morning. antacid   Past Week at Unknown time  . potassium phosphate, monobasic, (K-PHOS ORIGINAL) 500 MG tablet Take 500 mg by mouth daily.   Past Week at Unknown time  . predniSONE (DELTASONE) 10 MG tablet Take 10 mg by mouth daily.   Past Week at Unknown time  . sildenafil (REVATIO) 20 MG tablet Take 20-40 mg by mouth daily as needed (1 hour prior to sexual intercourse).   unknown  . white petrolatum (VASELINE) GEL Apply 1 application topically as needed (to where skin was removed).   Past Week at Unknown time    HOSPITAL MEDICATIONS: I have reviewed the patient's current medications.  VITALS: Blood pressure 152/70, pulse 130, temperature 98.3 F (36.8 C), temperature source Oral, resp. rate 23, height 5\' 8"  (1.727 m), weight 220 lb 7.4 oz (100 kg), SpO2 90 %.  INPUT/OUTPUT I/O last 3 completed shifts: In: 4250 [I.V.:3200; NG/GT:50; IV Piggyback:1000] Out: 1575 [Urine:1425; Emesis/NG output:150] Total I/O In: 530 [I.V.:200; NG/GT:30; IV Piggyback:300] Out: 100 [Urine:100]    PHYSICAL EXAM: General appearance: alert and no distress Neck: no adenopathy, no carotid bruit, no JVD, supple, symmetrical, trachea midline and thyroid not enlarged, symmetric, no tenderness/mass/nodules Lungs: clear to auscultation bilaterally Heart: irregularly irregular rhythm Abdomen: Soft ,  NT Extremities: extremities normal, atraumatic, no cyanosis or edema  LABS:  BMP  Recent Labs  05/23/14 0331 05/24/14 0248 05/25/14 0012  NA 136* 141 140  K 4.6 4.4 4.2  CL 100 105 105  CO2 22 24 24   GLUCOSE 202* 145* 160*  BUN 19 20 19   CREATININE 1.40* 1.20 1.14  CALCIUM 7.7* 7.5* 7.4*  GFRNONAA 46* 55* 59*  GFRAA 53* 64* 68*    CBC  Recent Labs Lab 05/25/14 0012  WBC 19.5*  RBC 3.80*  HGB 11.9*  HCT 36.8*  PLT 433*  MCV 96.8    HEMOGLOBIN A1C No results found for: HGBA1C, MPG  Cardiac Panel (last 3 results) No  results for input(s): CKTOTAL, CKMB, TROPONINI, RELINDX in the last 8760 hours.  BNP (last 3 results) No results for input(s): PROBNP in the last 8760 hours.  TSH No results for input(s): TSH in the last 8760 hours.  CHOLESTEROL No results for input(s): CHOL in the last 8760 hours.  Hepatic Function Panel  Recent Labs  05/22/14 0047  PROT 6.9  ALBUMIN 3.3*  AST 23  ALT 22  ALKPHOS 72  BILITOT 1.6*    IMAGING: No results found.   Tele: Afib with RVR  IMPRESSION: 1. Afib with RVR now 2 days post op.  2. HTN- Home meds on hold,  Norvasc and ARB, secondary to pt being NPO   RECOMMENDATION: 1. Start IV diltiazem for rate control. 2D echo. No anticoag at this time secondary to being 2 days post op. I suspect that he will spontaneously convert back to NSR (he was in NSR with PACs on admission). I do not feel he will need long term A/C if he converts spontaneously. 2. BP OK off meds currently. Restart when able to take meds  Time Spent Directly with Patient: 30 minutes  BERRY,JONATHAN J 05/25/2014, 9:04 AM

## 2014-05-25 NOTE — Evaluation (Signed)
Physical Therapy Evaluation Patient Details Name: Erik GoodellCharlie W Mcduffee MRN: [id number] DOB: 07/28/1931 Today's Date: 05/25/2014   History of Present Illness  80 yo WM came to ED for persistent and worsening generalized abdominal pain. Found to have diverticulitis with small intestine perforation s/p exp lap with small bowel resection 11/21. Pt with hx of myasthenia gravis. New onset Afib and scrotal edema  Clinical Impression  Pt very pleasant and eager to mobilize. Pt with HR 119-150 widely varied and non-sustained at any rate throughout session with 3 brief jumps to 170. AFib with RN aware and agreeable to mobility with plan for cardiology consult. Activity very limited due to cardiopulmonary status with pt 98% on 4L on arrival with 90-93% on RA with mobility and drop end of session to 88% with 2L reapplied end of session with RN present. Pt will benefit from acute therapy to maximize mobility, strength, function and gait to return pt to PLOF and decrease burden of care.     Follow Up Recommendations Home health PT;Supervision for mobility/OOB    Equipment Recommendations  Rolling walker with 5" wheels    Recommendations for Other Services       Precautions / Restrictions Precautions Precautions: Fall Precaution Comments: watch HR, NG tube      Mobility  Bed Mobility Overal bed mobility: Needs Assistance Bed Mobility: Supine to Sit     Supine to sit: Min assist;HOB elevated     General bed mobility comments: pt able to pivot bil LE to EOB but required cues and assist to elevate trunk from surface with HOB 30degrees  Transfers Overall transfer level: Needs assistance   Transfers: Sit to/from Stand;Stand Pivot Transfers Sit to Stand: Min guard Stand pivot transfers: Min assist       General transfer comment: cues for hand placement and safety with assist for stability and lines with pivot, decreased control of descent  Ambulation/Gait Ambulation/Gait assistance:  (unable  secondary to HR)              Stairs            Wheelchair Mobility    Modified Rankin (Stroke Patients Only)       Balance Overall balance assessment: Needs assistance   Sitting balance-Leahy Scale: Good       Standing balance-Leahy Scale: Fair                               Pertinent Vitals/Pain Pain Assessment: 0-10 Pain Score: 5  Pain Location: scrotal edema Pain Descriptors / Indicators: Sore    Home Living Family/patient expects to be discharged to:: Private residence Living Arrangements: Spouse/significant other Available Help at Discharge: Family Type of Home: House Home Access: Level entry     Home Layout: Two level;Laundry or work area in [employer] Mutual Insurancebasement Home Equipment: None      Prior Function Level of Independence: Independent               Higher education careers adviserHand Dominance        Extremity/Trunk Assessment   Upper Extremity Assessment: Overall WFL for tasks assessed           Lower Extremity Assessment: Overall WFL for tasks assessed      Cervical / Trunk Assessment: Normal  Communication   Communication: No difficulties  Cognition Arousal/Alertness: Awake/alert Behavior During Therapy: WFL for tasks assessed/performed Overall Cognitive Status: Within Functional Limits for tasks assessed  General Comments      Exercises        Assessment/Plan    PT Assessment Patient needs continued PT services  PT Diagnosis Difficulty walking   PT Problem List Decreased activity tolerance;Decreased balance;Decreased mobility;Cardiopulmonary status limiting activity;Decreased skin integrity;Pain;Obesity  PT Treatment Interventions Gait training;Functional mobility training;Therapeutic activities;Therapeutic exercise;Patient/family education   PT Goals (Current goals can be found in the Care Plan section) Acute Rehab PT Goals Patient Stated Goal: return to taking care of myself PT Goal Formulation: With  patient Time For Goal Achievement: 06/08/14 Potential to Achieve Goals: Good    Frequency Min 3X/week   Barriers to discharge Decreased caregiver support wife with recent MI and dgtr with breast CA with limited ability to assist    Co-evaluation               End of Session   Activity Tolerance: Treatment limited secondary to medical complications (Comment) (HR) Patient left: in chair;with call bell/phone within reach;with nursing/sitter in room Nurse Communication: Mobility status;Precautions         Time: [id number] PT Time Calculation (min) (ACUTE ONLY): 17 min   Charges:   PT Evaluation $Initial PT Evaluation Tier I: 1 Procedure PT Treatments $Therapeutic Activity: 8-22 mins   PT G CodesDelorse Lek:          Tabor, Xeng Kucher Beth 05/25/2014, 8:47 AM Delaney MeigsMaija Tabor Yarah Fuente, PT [phone]

## 2014-05-25 NOTE — Progress Notes (Signed)
  Echocardiogram 2D Echocardiogram has been performed.  Cathie BeamsGREGORY, Makalynn Berwanger 05/25/2014, 2:18 PM

## 2014-05-26 LAB — POCT I-STAT 7, (LYTES, BLD GAS, ICA,H+H)
ACID-BASE EXCESS: 1 mmol/L (ref 0.0–2.0)
BICARBONATE: 27 meq/L — AB (ref 20.0–24.0)
Calcium, Ion: 1.16 mmol/L (ref 1.13–1.30)
HCT: 36 % — ABNORMAL LOW (ref 39.0–52.0)
HEMOGLOBIN: 12.2 g/dL — AB (ref 13.0–17.0)
O2 Saturation: 100 %
POTASSIUM: 3 meq/L — AB (ref 3.7–5.3)
Patient temperature: 37
Sodium: 134 mEq/L — ABNORMAL LOW (ref 137–147)
TCO2: 28 mmol/L (ref 0–100)
pCO2 arterial: 48.4 mmHg — ABNORMAL HIGH (ref 35.0–45.0)
pH, Arterial: 7.355 (ref 7.350–7.450)
pO2, Arterial: 252 mmHg — ABNORMAL HIGH (ref 80.0–100.0)

## 2014-05-26 MED ORDER — FUROSEMIDE 10 MG/ML IJ SOLN
40.0000 mg | Freq: Once | INTRAMUSCULAR | Status: AC
  Administered 2014-05-26: 40 mg via INTRAVENOUS
  Filled 2014-05-26: qty 4

## 2014-05-26 NOTE — Progress Notes (Signed)
4 Days Post-Op  Subjective: Pt doing well this AM Passing flatus Appreciate Cardiology help-Dilt gtt  Objective: Vital signs in last 24 hours: Temp:  [98.1 F (36.7 C)-99.3 F (37.4 C)] 98.8 F (37.1 C) (11/25 0412) Pulse Rate:  [52-130] 94 (11/25 0600) Resp:  [17-31] 31 (11/25 0600) BP: (118-181)/(48-95) 181/65 mmHg (11/25 0600) SpO2:  [90 %-97 %] 94 % (11/25 0600) Last BM Date:  (PTA)  Intake/Output from previous day: 11/24 0701 - 11/25 0700 In: 2998 [I.V.:2438; NG/GT:60; IV Piggyback:500] Out: 1115 [Urine:1115] Intake/Output this shift: Total I/O In: -  Out: 125 [Urine:125]  General appearance: alert and cooperative Resp: clear to auscultation bilaterally Cardio: regular rate and rhythm, S1, S2 normal, no murmur, click, rub or gallop GI: soft, approp ttp, wound c/d/i, acitve BW  Lab Results:   Recent Labs  05/24/14 0248 05/25/14 0012  WBC 19.2* 19.5*  HGB 11.9* 11.9*  HCT 36.7* 36.8*  PLT 490* 433*   BMET  Recent Labs  05/24/14 0248 05/25/14 0012  NA 141 140  K 4.4 4.2  CL 105 105  CO2 24 24  GLUCOSE 145* 160*  BUN 20 19  CREATININE 1.20 1.14  CALCIUM 7.5* 7.4*    Anti-infectives: Anti-infectives    Start     Dose/Rate Route Frequency Ordered Stop   05/22/14 0900  ciprofloxacin (CIPRO) IVPB 400 mg     400 mg200 mL/hr over 60 Minutes Intravenous Every 12 hours 05/22/14 0821 05/29/14 0859   05/22/14 0900  metroNIDAZOLE (FLAGYL) IVPB 500 mg     500 mg100 mL/hr over 60 Minutes Intravenous Every 8 hours 05/22/14 0821 05/29/14 0859   05/22/14 0300  ciprofloxacin (CIPRO) IVPB 400 mg     400 mg200 mL/hr over 60 Minutes Intravenous  Once 05/22/14 0253 05/22/14 0434   05/22/14 0300  metroNIDAZOLE (FLAGYL) IVPB 500 mg     500 mg100 mL/hr over 60 Minutes Intravenous  Once 05/22/14 0253 05/22/14 0434      Assessment/Plan: s/p Procedure(s): EXPLORATORY LAPAROTOMY (N/A) SMALL BOWEL RESECTION (N/A) Clamp NGT x6hr if no n/v OK to DC Lasix Mobilize and  pulm toilet   LOS: 4 days    Erik Ehlersamirez Jr., [name] 05/26/2014

## 2014-05-26 NOTE — Progress Notes (Signed)
    SUBJECTIVE:  No chest pain.  No SOB   PHYSICAL EXAM Filed Vitals:   05/26/14 0400 05/26/14 0412 05/26/14 0500 05/26/14 0600  BP: 131/62  129/63 181/65  Pulse: 52  68 94  Temp:  98.8 F (37.1 C)    TempSrc:  Oral    Resp: 25  25 31   Height:      Weight:      SpO2: 94%  97% 94%   General:  No distress Lungs:  Clear Heart:  Irregular Abdomen:  Positive bowel sounds, no rebound no guarding Extremities:  Diffuse edema   LABS: No results found for: TROPONINI No results found for this or any previous visit (from the past 24 hour(s)).  Intake/Output Summary (Last 24 hours) at 05/26/14 0759 Last data filed at 05/26/14 0745  Gross per 24 hour  Intake   2998 ml  Output   1240 ml  Net   1758 ml   ECHO:  - Procedure narrative: Transthoracic echocardiography. Image quality was adequate. The study was technically difficult. - Left ventricle: The cavity size was normal. There was mild focal basal hypertrophy of the septum. Systolic function was mildly reduced. The estimated ejection fraction was in the range of 45% to 50%. There is akinesis of the apicalanteroseptal and apical myocardium. Doppler parameters are consistent with abnormal left ventricular relaxation (grade 1 diastolic dysfunction). - Left atrium: The atrium was mildly dilated.  ASSESSMENT AND PLAN:  Diverticulitis of small intestine with perforation with bleeding:  Plans per CCS.  NG out today    PAF (paroxysmal atrial fibrillation):  Still in fib but rate controlled.   Continue IV cardizem.  I am reluctant to start heparin or a NOAC this close to surgery.  If he remains in atrial fib in the AM I will use warfarin and cardizem PO.    CARDIOMYOPATHY:  His EF is slightly low but stable.   He has not known about this but it was present on his previous echo in 2004.  I agree with Lasix as his volume is up.  Start ARB back when he is taking orals.  We will follow.     Erik FearingJames [name] [facility] 05/26/2014 7:59  AM

## 2014-05-27 MED ORDER — DILTIAZEM HCL 30 MG PO TABS
30.0000 mg | ORAL_TABLET | Freq: Four times a day (QID) | ORAL | Status: DC
  Administered 2014-05-27 – 2014-05-28 (×4): 30 mg via ORAL
  Filled 2014-05-27 (×8): qty 1

## 2014-05-27 MED ORDER — DIPHENHYDRAMINE HCL 25 MG PO CAPS
25.0000 mg | ORAL_CAPSULE | Freq: Four times a day (QID) | ORAL | Status: DC | PRN
  Administered 2014-05-27 – 2014-06-02 (×9): 25 mg via ORAL
  Filled 2014-05-27 (×9): qty 1

## 2014-05-27 MED ORDER — WARFARIN SODIUM 2.5 MG PO TABS
2.5000 mg | ORAL_TABLET | Freq: Once | ORAL | Status: AC
  Administered 2014-05-27: 2.5 mg via ORAL
  Filled 2014-05-27: qty 1

## 2014-05-27 MED ORDER — HYDROCODONE-ACETAMINOPHEN 5-325 MG PO TABS: 1.0000 | ORAL_TABLET | ORAL | Status: DC | PRN

## 2014-05-27 MED ORDER — WARFARIN - PHARMACIST DOSING INPATIENT
Freq: Every day | Status: DC
  Administered 2014-05-29: 18:00:00

## 2014-05-27 MED ORDER — MORPHINE SULFATE 2 MG/ML IJ SOLN: 1.0000 mg | INTRAMUSCULAR | Status: DC | PRN

## 2014-05-27 NOTE — Progress Notes (Signed)
ANTICOAGULATION CONSULT NOTE - Initial Consult  Pharmacy Consult for Coumadin Indication: atrial fibrillation  No Known Allergies  Patient Measurements: Height: 5\' 8"  (172.7 cm) Weight: 220 lb 7.4 oz (100 kg) (estimated by scott crna) IBW/kg (Calculated) : 68.4   Vital Signs: Temp: 98.5 F (36.9 C) (11/26 0751) Temp Source: Oral (11/26 0751) BP: 124/68 mmHg (11/26 0751) Pulse Rate: 73 (11/26 0751)  Labs:  Recent Labs  05/25/14 0012  HGB 11.9*  HCT 36.8*  PLT 433*  CREATININE 1.14    Estimated Creatinine Clearance: 59.2 mL/min (by C-G formula based on Cr of 1.14).   Medical History: Past Medical History  Diagnosis Date  . Hypertension   . Myasthenia gravis   . Cellulitis   . Diverticula of small intestine     Assessment: [age]yo male with diverticulitis of small intestine with perforation and bleeding s/p exploratory laparotomy and distal small bowel resection on 11/21. Found to have irregular rate and rhythm on 11/24. Cardiology [facility]) noted pt to have paroxysmal Afib, rate controlled with diltiazem, reluctant to start heparin or NOAC d/t recent surgery, to begin warfarin.  H&H low but stable, PLT WNL, on heparin SubQ  Cipro 11/21 >> Flagyl 11/21 >>  Goal of Therapy:  INR 2-3 Monitor platelets by anticoagulation protocol: Yes  Plan: -Coumadin 2.5mg  x 1 -Daily INR -F/U CBC, PLT, s/sx of bleeding  Waynette Butteryegan K. Gita Dilger, PharmD Clinical Pharmacy Resident Pager: [phone] 05/27/2014 10:46 AM

## 2014-05-27 NOTE — Progress Notes (Signed)
[facility] Progress Note  5 Days Post-Op  Subjective: Pt says he feels down in the dumps.  No N/V, but feels full and has anorexia.  Up in chair.  Thinks he may have had a BM and feels like he has to go.  +flatus.  NG removed yesterday.  Tolerating clears.    Objective: Vital signs in last 24 hours: Temp:  [97.6 F (36.4 C)-99.5 F (37.5 C)] 99.3 F (37.4 C) (11/26 0312) Pulse Rate:  [58-110] 73 (11/26 0751) Resp:  [20-27] 26 (11/26 0751) BP: (124-159)/(54-76) 124/68 mmHg (11/26 0751) SpO2:  [85 %-96 %] 93 % (11/26 0751) Last BM Date:  (PTA)  Intake/Output from previous day: 11/25 0701 - 11/26 0700 In: 4050 [P.O.:840; I.V.:2310; IV Piggyback:900] Out: 3200 [Urine:3200] Intake/Output this shift:    PE: Gen:  Alert, NAD, pleasant Card:  Reg rate, Afib Ir/Ir, no M/G/R heard Pulm:  CTA, no W/R/R, IS up to 1700 Abd: Soft, distended, mild tenderness over midline wound, +BS, no HSM, open midline wound is clean and dry   Lab Results:   Recent Labs  05/25/14 0012  WBC 19.5*  HGB 11.9*  HCT 36.8*  PLT 433*   BMET  Recent Labs  05/25/14 0012  NA 140  K 4.2  CL 105  CO2 24  GLUCOSE 160*  BUN 19  CREATININE 1.14  CALCIUM 7.4*   PT/INR No results for input(s): LABPROT, INR in the last 72 hours. CMP     Component Value Date/Time   NA 140 05/25/2014 0012   K 4.2 05/25/2014 0012   CL 105 05/25/2014 0012   CO2 24 05/25/2014 0012   GLUCOSE 160* 05/25/2014 0012   BUN 19 05/25/2014 0012   CREATININE 1.14 05/25/2014 0012   CALCIUM 7.4* 05/25/2014 0012   PROT 6.9 05/22/2014 0047   ALBUMIN 3.3* 05/22/2014 0047   AST 23 05/22/2014 0047   ALT 22 05/22/2014 0047   ALKPHOS 72 05/22/2014 0047   BILITOT 1.6* 05/22/2014 0047   GFRNONAA 59* 05/25/2014 0012   GFRAA 68* 05/25/2014 0012   Lipase     Component Value Date/Time   LIPASE 52 05/22/2014 0047       Studies/Results: No results found.  Anti-infectives: Anti-infectives    Start      Dose/Rate Route Frequency Ordered Stop   05/22/14 0900  ciprofloxacin (CIPRO) IVPB 400 mg     400 mg200 mL/hr over 60 Minutes Intravenous Every 12 hours 05/22/14 0821 05/29/14 0859   05/22/14 0900  metroNIDAZOLE (FLAGYL) IVPB 500 mg     500 mg100 mL/hr over 60 Minutes Intravenous Every 8 hours 05/22/14 0821 05/29/14 0859   05/22/14 0300  ciprofloxacin (CIPRO) IVPB 400 mg     400 mg200 mL/hr over 60 Minutes Intravenous  Once 05/22/14 0253 05/22/14 0434   05/22/14 0300  metroNIDAZOLE (FLAGYL) IVPB 500 mg     500 mg100 mL/hr over 60 Minutes Intravenous  Once 05/22/14 0253 05/22/14 0434       Assessment/Plan Diverticulitis of small bowel with perforation POD #5 s/p Ex lap, SBR AFIB - on IV drip cardizem  Plan: 1.  NG discontinued yesterday, tolerating clears, had a large BM, advance to fulls as tolerable 2.  Ambulate and IS 3.  SCD's and heparin 4.  Antibioitics day #6/7 5.  Continue midline dressing changes BID WD 6.  To floor when off IV drip cardizem and on orals 7.  If tolerating lunch can start on oral cardizem and coumadin  LOS: 5 days    Aris GeorgiaDORT, Rasheema Truluck 05/27/2014, 8:38 AM Pager: [phone]

## 2014-05-27 NOTE — Progress Notes (Signed)
At about 2100 while performing mouth care on patient, patient had an episode of vomiting. Pt did not endorse being nauseated beforehand. Pt vomitus was both clear and red in color, thin but some secretions were thick. Did not appear to be blood. Patient still had dinner tray in room which had red colored italian ice. Patient given PRN medication for relief. Per patient- he did not feel nauseated before or after episode. Stated he tasted something on the yaunker that made him gag subsequently causing the vomitus. Will continue to monitor patient.

## 2014-05-27 NOTE — Progress Notes (Signed)
     Subjective:  POD #4 exploratory lap, SB resection. Post -op AFIB. Pt denies CP, SOB  Objective:  Temp:  [97.4 F (36.3 C)-99.5 F (37.5 C)] 99.3 F (37.4 C) (11/26 0312) Pulse Rate:  [58-110] 79 (11/26 0312) Resp:  [20-29] 23 (11/26 0312) BP: (124-159)/(54-76) 124/76 mmHg (11/26 0312) SpO2:  [85 %-96 %] 96 % (11/26 0312) Weight change:   Intake/Output from previous day: 11/25 0701 - 11/26 0700 In: 4050 [P.O.:840; I.V.:2310; IV Piggyback:900] Out: 3200 [Urine:3200]  Intake/Output from this shift:    Physical Exam: General appearance: alert and no distress Neck: no adenopathy, no carotid bruit, no JVD, supple, symmetrical, trachea midline and thyroid not enlarged, symmetric, no tenderness/mass/nodules Lungs: clear to auscultation bilaterally Heart: irregularly irregular rhythm Extremities: extremities normal, atraumatic, no cyanosis or edema  Lab Results: No results found for this or any previous visit (from the past 48 hour(s)).  Imaging: Imaging results have been reviewed  Tele: AFIB with CVR  Assessment/Plan:   1. Active Problems: 2.   Diverticulitis of small intestine with perforation with bleeding 3.   PAF (paroxysmal atrial fibrillation) 4.   Time Spent Directly with Patient:  20 minutes  Length of Stay:  LOS: 5 days   POD #4 Exploratory Lap with SB resection. Post-op PAF with RVR. Currently rate controlled on IV dilt. 2D shows ild LV dysfunction (EF 45%). I/O + 11Liters. Agree with IV lasix 40 mg daily for now. Can convert to PO dilt 30 mg PO q6hrs when not NPO then consolidate to long acting agent. Start coumadin AC per pharmacy  when not NPO and when OK with CCS. If her doesn't convert spontaneously prior to D/C will require OP DCCV 4-6 weeks post D/C.  Runell GessBERRY,Ayodele Sangalang J 05/27/2014, 7:49 AM

## 2014-05-27 NOTE — Progress Notes (Signed)
Patient complaining of not being able to sleep. On call [facility] doctor paged. New orders received. Will continue to monitor patient.

## 2014-05-28 DIAGNOSIS — I10 Essential (primary) hypertension: Secondary | ICD-10-CM

## 2014-05-28 DIAGNOSIS — I5023 Acute on chronic systolic (congestive) heart failure: Secondary | ICD-10-CM

## 2014-05-28 LAB — CBC
HEMATOCRIT: 38.4 % — AB (ref 39.0–52.0)
Hemoglobin: 12.6 g/dL — ABNORMAL LOW (ref 13.0–17.0)
MCH: 30.6 pg (ref 26.0–34.0)
MCHC: 32.8 g/dL (ref 30.0–36.0)
MCV: 93.2 fL (ref 78.0–100.0)
Platelets: 496 10*3/uL — ABNORMAL HIGH (ref 150–400)
RBC: 4.12 MIL/uL — ABNORMAL LOW (ref 4.22–5.81)
RDW: 14.8 % (ref 11.5–15.5)
WBC: 16.6 10*3/uL — ABNORMAL HIGH (ref 4.0–10.5)

## 2014-05-28 LAB — CULTURE, BLOOD (ROUTINE X 2)
CULTURE: NO GROWTH
Culture: NO GROWTH

## 2014-05-28 LAB — BASIC METABOLIC PANEL
Anion gap: 11 (ref 5–15)
BUN: 15 mg/dL (ref 6–23)
CALCIUM: 7.2 mg/dL — AB (ref 8.4–10.5)
CO2: 26 mEq/L (ref 19–32)
Chloride: 103 mEq/L (ref 96–112)
Creatinine, Ser: 1.02 mg/dL (ref 0.50–1.35)
GFR calc Af Amer: 78 mL/min — ABNORMAL LOW (ref >90)
GFR, EST NON AFRICAN AMERICAN: 67 mL/min — AB (ref >90)
GLUCOSE: 201 mg/dL — AB (ref 70–99)
POTASSIUM: 3.4 meq/L — AB (ref 3.7–5.3)
Sodium: 140 mEq/L (ref 137–147)

## 2014-05-28 LAB — PROTIME-INR
INR: 1.46 (ref 0.00–1.49)
Prothrombin Time: 17.8 seconds — ABNORMAL HIGH (ref 11.6–15.2)

## 2014-05-28 MED ORDER — COUMADIN BOOK
Freq: Once | Status: AC
  Administered 2014-05-28: 18:00:00
  Filled 2014-05-28: qty 1

## 2014-05-28 MED ORDER — SODIUM CHLORIDE 0.9 % IJ SOLN
10.0000 mL | INTRAMUSCULAR | Status: DC | PRN
  Administered 2014-05-28 – 2014-06-02 (×6): 10 mL
  Filled 2014-05-28 (×4): qty 40

## 2014-05-28 MED ORDER — ALTEPLASE 2 MG IJ SOLR
2.0000 mg | Freq: Once | INTRAMUSCULAR | Status: AC
  Administered 2014-05-28: 2 mg
  Filled 2014-05-28: qty 2

## 2014-05-28 MED ORDER — DILTIAZEM HCL 60 MG PO TABS
60.0000 mg | ORAL_TABLET | Freq: Three times a day (TID) | ORAL | Status: DC
  Filled 2014-05-28 (×3): qty 1

## 2014-05-28 MED ORDER — WARFARIN SODIUM 2.5 MG PO TABS
2.5000 mg | ORAL_TABLET | Freq: Once | ORAL | Status: AC
  Administered 2014-05-28: 2.5 mg via ORAL
  Filled 2014-05-28: qty 1

## 2014-05-28 MED ORDER — WARFARIN VIDEO: Freq: Once | Status: DC

## 2014-05-28 MED ORDER — FUROSEMIDE 10 MG/ML IJ SOLN
40.0000 mg | Freq: Once | INTRAMUSCULAR | Status: AC
  Administered 2014-05-28: 40 mg via INTRAVENOUS
  Filled 2014-05-28: qty 4

## 2014-05-28 MED ORDER — DILTIAZEM HCL 60 MG PO TABS
60.0000 mg | ORAL_TABLET | Freq: Four times a day (QID) | ORAL | Status: DC
  Administered 2014-05-28 – 2014-05-29 (×3): 60 mg via ORAL
  Filled 2014-05-28 (×8): qty 1

## 2014-05-28 NOTE — Progress Notes (Signed)
[facility] Progress Note  6 Days Post-Op  Subjective: Pt doing well, less pain.  Tolerating diet.  Mobilizing okay.  Started on coumadin/oral Cardizem yesterday.    Objective: Vital signs in last 24 hours: Temp:  [98.2 F (36.8 C)-99.5 F (37.5 C)] 98.6 F (37 C) (11/27 0447) Pulse Rate:  [70-89] 86 (11/27 0447) Resp:  [21-24] 24 (11/27 0447) BP: (129-156)/(58-78) 143/78 mmHg (11/27 0447) SpO2:  [92 %-93 %] 93 % (11/27 0447) Last BM Date: 05/27/14  Intake/Output from previous day: 11/26 0701 - 11/27 0700 In: 4422.5 [P.O.:1320; I.V.:2702.5; IV Piggyback:400] Out: 700 [Urine:700] Intake/Output this shift:    PE: Gen: Alert, NAD, pleasant Card: Reg rate, regular rhythm, no M/G/R heard Pulm: CTA, no W/R/R, IS up to 1700 Abd: Soft, distended, mild tenderness over midline wound, +BS, no HSM, open midline wound is clean and dry  Lab Results:  No results for input(s): WBC, HGB, HCT, PLT in the last 72 hours. BMET No results for input(s): NA, K, CL, CO2, GLUCOSE, BUN, CREATININE, CALCIUM in the last 72 hours. PT/INR No results for input(s): LABPROT, INR in the last 72 hours. CMP     Component Value Date/Time   NA 140 05/25/2014 0012   K 4.2 05/25/2014 0012   CL 105 05/25/2014 0012   CO2 24 05/25/2014 0012   GLUCOSE 160* 05/25/2014 0012   BUN 19 05/25/2014 0012   CREATININE 1.14 05/25/2014 0012   CALCIUM 7.4* 05/25/2014 0012   PROT 6.9 05/22/2014 0047   ALBUMIN 3.3* 05/22/2014 0047   AST 23 05/22/2014 0047   ALT 22 05/22/2014 0047   ALKPHOS 72 05/22/2014 0047   BILITOT 1.6* 05/22/2014 0047   GFRNONAA 59* 05/25/2014 0012   GFRAA 68* 05/25/2014 0012   Lipase     Component Value Date/Time   LIPASE 52 05/22/2014 0047       Studies/Results: No results found.  Anti-infectives: Anti-infectives    Start     Dose/Rate Route Frequency Ordered Stop   05/22/14 0900  ciprofloxacin (CIPRO) IVPB 400 mg     400 mg200 mL/hr over 60 Minutes Intravenous  Every 12 hours 05/22/14 0821 05/29/14 0859   05/22/14 0900  metroNIDAZOLE (FLAGYL) IVPB 500 mg     500 mg100 mL/hr over 60 Minutes Intravenous Every 8 hours 05/22/14 0821 05/29/14 0859   05/22/14 0300  ciprofloxacin (CIPRO) IVPB 400 mg     400 mg200 mL/hr over 60 Minutes Intravenous  Once 05/22/14 0253 05/22/14 0434   05/22/14 0300  metroNIDAZOLE (FLAGYL) IVPB 500 mg     500 mg100 mL/hr over 60 Minutes Intravenous  Once 05/22/14 0253 05/22/14 0434       Assessment/Plan Diverticulitis of small bowel with perforation POD #6 s/p Ex lap, SBR AFIB - on IV drip cardizem  Plan: 1. NG discontinued, tolerating fulls, had a 3 large BM 2. Ambulate and IS 3. SCD's and heparin 4. Antibioitics day #7/7, d/c tomorrow 5. Continue midline dressing changes BID WD 6. To floor today, started on oral cardizem and coumadin yesterday 7.  Home Saturday or Sunday, PT recommending HH    LOS: 6 days    DORT, Aundra MilletMEGAN 05/28/2014, 7:52 AM Pager: [phone]

## 2014-05-28 NOTE — Progress Notes (Signed)
Physical Therapy Treatment Patient Details Name: [name] MRN: [id number] DOB: 02/24/1932 Today's Date: 05/28/2014    History of Present Illness 80 yo WM came to ED for persistent and worsening generalized abdominal pain. Found to have diverticulitis with small intestine perforation s/p exp lap with small bowel resection 11/21. Pt with hx of myasthenia gravis. New onset Afib and scrotal edema    PT Comments    Able to ambulated and no complaints of SOB or distress despite increased HR and decreased SpO2.  Family present during session and encouraged by his ability to mobilize in hallway.  Will need continued HHPT at d/c.  Follow Up Recommendations  Home health PT;Supervision for mobility/OOB     Equipment Recommendations  Rolling walker with 5" wheels    Recommendations for Other Services       Precautions / Restrictions Precautions Precautions: Fall    Mobility  Bed Mobility Overal bed mobility: Needs Assistance Bed Mobility: Supine to Sit     Supine to sit: Min assist;HOB elevated     General bed mobility comments: cues to roll to side due to abdominal wound, but limited due to scrotal edema  Transfers Overall transfer level: Needs assistance Equipment used: Pushed w/c Transfers: Sit to/from Stand Sit to Stand: Min assist            Ambulation/Gait Ambulation/Gait assistance: Min assist Ambulation Distance (Feet): 200 Feet Assistive device:  (pushed wheelchair) Gait Pattern/deviations: Step-through pattern;Wide base of support;Decreased stride length     General Gait Details: multiple stops with cues for pursed lip breathing due to decreased O2 sats; HR in a-fib up to 155 max, 120-130's sustained    Stairs            Wheelchair Mobility    Modified Rankin (Stroke Patients Only)       Balance Overall balance assessment: Needs assistance   Sitting balance-Leahy Scale: Good       Standing balance-Leahy Scale: Fair                       Cognition Arousal/Alertness: Awake/alert Behavior During Therapy: WFL for tasks assessed/performed Overall Cognitive Status: Within Functional Limits for tasks assessed                      Exercises      General Comments        Pertinent Vitals/Pain Pain Assessment: Faces Faces Pain Scale: Hurts little more Pain Location: scrotal edema Pain Descriptors / Indicators: Sore Pain Intervention(s): Monitored during session;Repositioned    Home Living                      Prior Function            PT Goals (current goals can now be found in the care plan section) Progress towards PT goals: Progressing toward goals    Frequency  Min 3X/week    PT Plan Current plan remains appropriate    Co-evaluation             End of Session Equipment Utilized During Treatment: Gait belt Activity Tolerance: Treatment limited secondary to medical complications (Comment) (increased HR and decreased O2 sats) Patient left: in bed;with call bell/phone within reach     Time: 1050-1115 PT Time Calculation (min) (ACUTE ONLY): 25 min  Charges:  $Gait Training: 8-22 mins $Therapeutic Activity: 8-22 mins  G Codes:      WYNN,CYNDI 05/28/2014, 1:14 PM Sheran Lawlessyndi Wynn, PT [phone] 05/28/2014

## 2014-05-28 NOTE — Progress Notes (Signed)
ANTICOAGULATION CONSULT NOTE - Follow-Up Consult  Pharmacy Consult for Coumadin Indication: atrial fibrillation  No Known Allergies  Patient Measurements: Height: 5\' 8"  (172.7 cm) Weight: 215 lb 13.3 oz (97.9 kg) IBW/kg (Calculated) : 68.4   Vital Signs: Temp: 98.4 F (36.9 C) (11/27 0700) Temp Source: Oral (11/27 0700) BP: 126/90 mmHg (11/27 0710) Pulse Rate: 93 (11/27 0710)  Labs:  Recent Labs  05/28/14 0915  HGB 12.6*  HCT 38.4*  PLT 496*  LABPROT 17.8*  INR 1.46    Estimated Creatinine Clearance: 58.6 mL/min (by C-G formula based on Cr of 1.14).   Medical History: Past Medical History  Diagnosis Date  . Hypertension   . Myasthenia gravis   . Cellulitis   . Diverticula of small intestine     Assessment: 80yo male with diverticulitis of small intestine with perforation and bleeding s/p exploratory laparotomy and distal small bowel resection on 11/21. Found to have irregular rate and rhythm on 11/24. Cardiology [facility]) noted pt to have paroxysmal Afib, rate controlled with diltiazem, reluctant to start heparin or NOAC d/t recent surgery, started on warfarin 11/26. INR 1.46 today (11/27).  H&H stable, PLT WNL, on heparin SubQ No bleeding noted  Cipro 11/21 >>  Flagyl 11/21 >>  Goal of Therapy:  INR 2-3 Monitor platelets by anticoagulation protocol: Yes  Plan: -Coumadin 2.5mg  x 1 -Daily INR -F/U CBC, PLT, s/sx of bleeding -Likely weekend discharge  Waynette Butteryegan K. Maryana Pittmon, PharmD Clinical Pharmacy Resident Pager: [phone] 05/28/2014 11:21 AM

## 2014-05-28 NOTE — Progress Notes (Signed)
[facility]. Pt to be transferred to 6N25. Family at bedside and informed.

## 2014-05-28 NOTE — Progress Notes (Signed)
Attempted to call report

## 2014-05-28 NOTE — Progress Notes (Addendum)
[facility]: [name] Date of Encounter: 05/28/2014  Principal Problem:   Diverticulitis of small intestine with perforation with bleeding Active Problems:   HTN (hypertension)   PAF (paroxysmal atrial fibrillation)   Primary Cardiologist: Dr. Allyson SabalBerry saw, previously patient of [facility]  Patient Profile: [age] male w/ no previous cardiac issues, hx HTN, admitted 11/21 w/ abd pain--> exp lap 11/21--> post-op afib RVR, cards following since 11/24. EF 45-50% by echo, grade 1 dd, no sig change from 2004.  SUBJECTIVE: No awareness of afib, very active PTA, wants to increase activity. Eating well.  OBJECTIVE Filed Vitals:   05/28/14 0038 05/28/14 0447 05/28/14 0700 05/28/14 0710  BP: 156/58 143/78  126/90  Pulse: 89 86  93  Temp: 98.6 F (37 C) 98.6 F (37 C) 98.4 F (36.9 C)   TempSrc: Oral Oral Oral   Resp: 21 24  25   Height:      Weight:      SpO2: 92% 93%  90%    Intake/Output Summary (Last 24 hours) at 05/28/14 1033 Last data filed at 05/28/14 [id number]  Gross per 24 hour  Intake 4302.5 ml  Output    725 ml  Net 3577.5 ml   Filed Weights   05/22/14 0500  Weight: 220 lb 7.4 oz (100 kg)    PHYSICAL EXAM General: Well developed, well nourished, male in no acute distress. Head: Normocephalic, atraumatic.  Neck: Supple without bruits, JVP 9 cm. Lungs:  Resp regular and unlabored, rales bases. Heart: Irreg irreg, S1, S2, no S3, S4, or murmur; no rub. Abdomen: Soft, tender, ?slightly distended, BS present.  Extremities: No clubbing, cyanosis, trace edema.  Neuro: Alert and oriented X 3. Moves all extremities spontaneously. Psych: Normal affect.  LABS: CBC:  Recent Labs  05/28/14 0915  WBC 16.6*  HGB 12.6*  HCT 38.4*  MCV 93.2  PLT 496*   TELE:  Atrial fib, generally well-controlled  Current Medications:  . antiseptic oral rinse  7 mL Mouth Rinse QID  . chlorhexidine  15 mL Mouth Rinse BID  . ciprofloxacin  400 mg Intravenous Q12H  .  diltiazem  30 mg Oral 4 times per day  . heparin  5,000 Units Subcutaneous 3 times per day  . hydrocortisone sod succinate (SOLU-CORTEF) inj  25 mg Intravenous Q12H  . metronidazole  500 mg Intravenous Q8H  . Warfarin - Pharmacist Dosing Inpatient   Does not apply q1800   . sodium chloride 100 mL/hr at 05/28/14 0118    ASSESSMENT AND PLAN: Principal Problem:   Diverticulitis of small intestine with perforation with bleeding - POD # 6, per trauma team  Active Problems:   HTN (hypertension) - SBP 120s-150s on current rx.    PAF (paroxysmal atrial fibrillation) - tolerating PO Cardizem well, will increase dose slightly  to 60 mg q 8 hr, change to CD 180 mg in am if tolerates well. This may help limit HR increase when his activity increases.    Anticoagulation - subcu heparin, loading warfarin    Volume status - I/0 + by > 15 liters since admission, was on Lasix 40 mg daily PTA, ck daily weights, will give Lasix 40 mg IV x 1, d/c IVF since pt taking POs. May need more diuresis.   Signed, [name] , PA-C 10:33 AM 05/28/2014   The patient was seen, examined and discussed with Erik Demarkhonda Barrett, PA-C and I agree with the above.   [age] year old male admitted with diverticulitis  of small intestine with perforation and was found to be in a-fib with RVR. On PO Cardizem, rate controlled but still close to 100 and hypertensive, we will increase to 60 mg po Q^[name] and switch to 180 mg PO CD in the am. Mildly fluid overloaded on physical exam, give lasix 40 mg iv x 1 and reevaluate in the am. Crea improving 1.4 -->1.2-->1.1. INR 1.46 today, no bridging due to recent bleeding.   [name], [name] 05/28/2014

## 2014-05-28 NOTE — Progress Notes (Signed)
Pt transferred to 6N25.

## 2014-05-29 LAB — BASIC METABOLIC PANEL
ANION GAP: 12 (ref 5–15)
BUN: 14 mg/dL (ref 6–23)
CO2: 23 mEq/L (ref 19–32)
CREATININE: 0.93 mg/dL (ref 0.50–1.35)
Calcium: 7.2 mg/dL — ABNORMAL LOW (ref 8.4–10.5)
Chloride: 105 mEq/L (ref 96–112)
GFR calc non Af Amer: 77 mL/min — ABNORMAL LOW (ref >90)
GFR, EST AFRICAN AMERICAN: 90 mL/min — AB (ref >90)
Glucose, Bld: 186 mg/dL — ABNORMAL HIGH (ref 70–99)
Potassium: 3.6 mEq/L — ABNORMAL LOW (ref 3.7–5.3)
SODIUM: 140 meq/L (ref 137–147)

## 2014-05-29 LAB — CBC
HCT: 38.9 % — ABNORMAL LOW (ref 39.0–52.0)
Hemoglobin: 12.6 g/dL — ABNORMAL LOW (ref 13.0–17.0)
MCH: 30.2 pg (ref 26.0–34.0)
MCHC: 32.4 g/dL (ref 30.0–36.0)
MCV: 93.3 fL (ref 78.0–100.0)
PLATELETS: 479 10*3/uL — AB (ref 150–400)
RBC: 4.17 MIL/uL — ABNORMAL LOW (ref 4.22–5.81)
RDW: 14.8 % (ref 11.5–15.5)
WBC: 16 10*3/uL — ABNORMAL HIGH (ref 4.0–10.5)

## 2014-05-29 LAB — CLOSTRIDIUM DIFFICILE BY PCR: Toxigenic C. Difficile by PCR: NEGATIVE

## 2014-05-29 LAB — PROTIME-INR
INR: 1.42 (ref 0.00–1.49)
PROTHROMBIN TIME: 17.5 s — AB (ref 11.6–15.2)

## 2014-05-29 MED ORDER — FUROSEMIDE 40 MG PO TABS
40.0000 mg | ORAL_TABLET | Freq: Every day | ORAL | Status: DC
  Administered 2014-05-29: 40 mg via ORAL
  Filled 2014-05-29 (×2): qty 1

## 2014-05-29 MED ORDER — DILTIAZEM HCL ER COATED BEADS 180 MG PO CP24
180.0000 mg | ORAL_CAPSULE | Freq: Every day | ORAL | Status: DC
  Administered 2014-05-29 – 2014-05-31 (×3): 180 mg via ORAL
  Filled 2014-05-29 (×5): qty 1

## 2014-05-29 MED ORDER — WARFARIN SODIUM 5 MG PO TABS
5.0000 mg | ORAL_TABLET | Freq: Once | ORAL | Status: AC
Start: 2014-05-29 — End: 2014-05-29
  Administered 2014-05-29: 5 mg via ORAL
  Filled 2014-05-29 (×2): qty 1

## 2014-05-29 NOTE — Progress Notes (Signed)
Patient ID: [name], male   DOB: [birth year], 80 y.o.   MRN: [id number] [facility] Progress Note:   7 Days Post-Op  Subjective: Mental status is clear.  No complaints except skin is weeping fluid Objective: Vital signs in last 24 hours: Temp:  [98.2 F (36.8 C)-98.7 F (37.1 C)] 98.3 F (36.8 C) (11/28 0517) Pulse Rate:  [81-145] 88 (11/28 0517) Resp:  [16-20] 16 (11/28 0517) BP: (126-142)/(57-91) 142/91 mmHg (11/28 0517) SpO2:  [90 %-97 %] 94 % (11/28 0517) Weight:  [215 lb 9.8 oz (97.8 kg)-216 lb 14.9 oz (98.4 kg)] 216 lb 14.9 oz (98.4 kg) (11/28 0517)  Intake/Output from previous day: 11/27 0701 - 11/28 0700 In: 640 [P.O.:240; IV Piggyback:400] Out: 1725 [Urine:1725] Intake/Output this shift:    Physical Exam: Work of breathing is not labored.  Abdomen is nontender.  Taking diet.  Skin of arms appears very thin and fragile.    Lab Results:  Results for orders placed or performed during the hospital encounter of 05/22/14 (from the past 48 hour(s))  Protime-INR     Status: Abnormal   Collection Time: 05/28/14  9:15 AM  Result Value Ref Range   Prothrombin Time 17.8 (H) 11.6 - 15.2 seconds   INR 1.46 0.00 - 1.49  CBC     Status: Abnormal   Collection Time: 05/28/14  9:15 AM  Result Value Ref Range   WBC 16.6 (H) 4.0 - 10.5 K/uL   RBC 4.12 (L) 4.22 - 5.81 MIL/uL   Hemoglobin 12.6 (L) 13.0 - 17.0 g/dL   HCT 38.4 (L) 39.0 - 52.0 %   MCV 93.2 78.0 - 100.0 fL   MCH 30.6 26.0 - 34.0 pg   MCHC 32.8 30.0 - 36.0 g/dL   RDW 14.8 11.5 - 15.5 %   Platelets 496 (H) 150 - 400 K/uL  Basic metabolic panel     Status: Abnormal   Collection Time: 05/28/14  3:49 PM  Result Value Ref Range   Sodium 140 137 - 147 mEq/L   Potassium 3.4 (L) 3.7 - 5.3 mEq/L   Chloride 103 96 - 112 mEq/L   CO2 26 19 - 32 mEq/L   Glucose, Bld 201 (H) 70 - 99 mg/dL   BUN 15 6 - 23 mg/dL   Creatinine, Ser 1.02 0.50 - 1.35 mg/dL   Calcium 7.2 (L) 8.4 - 10.5 mg/dL   GFR calc non Af Amer  67 (L) >90 mL/min   GFR calc Af Amer 78 (L) >90 mL/min    Comment: (NOTE) The eGFR has been calculated using the CKD EPI equation. This calculation has not been validated in all clinical situations. eGFR's persistently <90 mL/min signify possible Chronic Kidney Disease.    Anion gap 11 5 - 15  Protime-INR     Status: Abnormal   Collection Time: 05/29/14  2:43 AM  Result Value Ref Range   Prothrombin Time 17.5 (H) 11.6 - 15.2 seconds   INR 1.42 0.00 - 1.49  CBC     Status: Abnormal   Collection Time: 05/29/14  2:43 AM  Result Value Ref Range   WBC 16.0 (H) 4.0 - 10.5 K/uL   RBC 4.17 (L) 4.22 - 5.81 MIL/uL   Hemoglobin 12.6 (L) 13.0 - 17.0 g/dL   HCT 38.9 (L) 39.0 - 52.0 %   MCV 93.3 78.0 - 100.0 fL   MCH 30.2 26.0 - 34.0 pg   MCHC 32.4 30.0 - 36.0 g/dL   RDW 14.8 11.5 -  15.5 %   Platelets 479 (H) 150 - 400 K/uL  Basic metabolic panel     Status: Abnormal   Collection Time: 05/29/14  2:43 AM  Result Value Ref Range   Sodium 140 137 - 147 mEq/L   Potassium 3.6 (L) 3.7 - 5.3 mEq/L   Chloride 105 96 - 112 mEq/L   CO2 23 19 - 32 mEq/L   Glucose, Bld 186 (H) 70 - 99 mg/dL   BUN 14 6 - 23 mg/dL   Creatinine, Ser 0.93 0.50 - 1.35 mg/dL   Calcium 7.2 (L) 8.4 - 10.5 mg/dL   GFR calc non Af Amer 77 (L) >90 mL/min   GFR calc Af Amer 90 (L) >90 mL/min    Comment: (NOTE) The eGFR has been calculated using the CKD EPI equation. This calculation has not been validated in all clinical situations. eGFR's persistently <90 mL/min signify possible Chronic Kidney Disease.    Anion gap 12 5 - 15    Radiology/Results: No results found.  Anti-infectives: Anti-infectives    Start     Dose/Rate Route Frequency Ordered Stop   05/22/14 0900  ciprofloxacin (CIPRO) IVPB 400 mg     400 mg200 mL/hr over 60 Minutes Intravenous Every 12 hours 05/22/14 0821 05/28/14 2247   05/22/14 0900  metroNIDAZOLE (FLAGYL) IVPB 500 mg     500 mg100 mL/hr over 60 Minutes Intravenous Every 8 hours 05/22/14 0821  05/29/14 0859   05/22/14 0300  ciprofloxacin (CIPRO) IVPB 400 mg     400 mg200 mL/hr over 60 Minutes Intravenous  Once 05/22/14 0253 05/22/14 0434   05/22/14 0300  metroNIDAZOLE (FLAGYL) IVPB 500 mg     500 mg100 mL/hr over 60 Minutes Intravenous  Once 05/22/14 0253 05/22/14 0434      Assessment/Plan: Problem List: Patient Active Problem List   Diagnosis Date Noted  . PAF (paroxysmal atrial fibrillation)   . Diverticulitis of small intestine with perforation with bleeding 05/22/2014  . Cellulitis of hand 12/13/2011  . 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 HTN (hypertension) 12/13/2011    Was transferred to 6N;  C dif from yesterday is pending.  Improving 7 Days Post-Op    LOS: 7 days   Matt B. Hassell Done, MD, [facility] [phone] beeper [phone]  05/29/2014 7:50 AM

## 2014-05-29 NOTE — Progress Notes (Deleted)
Patient in Afib with undulating HR. Asymptomatic. Possible cardiac contusion/blunt cardiac injury. Will transfer to the SDU for control. Other studies to be done.  Jordayn Mink O. Kesleigh Morson, III, MD, FACS [phone] Trauma Surgeon 

## 2014-05-29 NOTE — Progress Notes (Signed)
ANTICOAGULATION CONSULT NOTE - Follow-Up Consult  Pharmacy Consult for Coumadin Indication: atrial fibrillation  No Known Allergies  Patient Measurements: Height: 5\' 11"  (180.3 cm) Weight: 216 lb 14.9 oz (98.4 kg) IBW/kg (Calculated) : 75.3   Vital Signs: Temp: 98.3 F (36.8 C) (11/28 0517) Temp Source: Oral (11/28 0517) BP: 142/91 mmHg (11/28 0517) Pulse Rate: 88 (11/28 0517)  Labs:  Recent Labs  05/28/14 0915 05/28/14 1549 05/29/14 0243  HGB 12.6*  --  12.6*  HCT 38.4*  --  38.9*  PLT 496*  --  479*  LABPROT 17.8*  --  17.5*  INR 1.46  --  1.42  CREATININE  --  1.02 0.93    Estimated Creatinine Clearance: 75.7 mL/min (by C-G formula based on Cr of 0.93).   Medical History: Past Medical History  Diagnosis Date  . Hypertension   . Myasthenia gravis   . Cellulitis   . Diverticula of small intestine     Assessment: 80yo male with diverticulitis of small intestine with perforation and bleeding s/p exploratory laparotomy and distal small bowel resection on 11/21. Found to have irregular rate and rhythm on 11/24. Cardiology [facility]) noted pt to have paroxysmal Afib, rate controlled with diltiazem, reluctant to start heparin or NOAC d/t recent surgery, started on warfarin 11/26. INR 1.42  H&H stable, PLT WNL, on heparin SubQ No bleeding noted  Cipro 11/21 >> 11/27 Flagyl 11/21 >>11/27  Goal of Therapy:  INR 2-3 Monitor platelets by anticoagulation protocol: Yes  Plan: -Coumadin 5 mg x 1 -Daily INR -F/U CBC, PLT, s/sx of bleeding  Thanks for allowing pharmacy to be a part of this patient's care.  Talbert CageLora Chaska Hagger, PharmD Clinical Pharmacist, [phone]   05/29/2014 9:37 AM

## 2014-05-29 NOTE — Progress Notes (Signed)
Consulting cardiologist: Dr. Nanetta BattyJonathan Berry  Seen for followup: Atrial fibrillation  Subjective:    No chest pain or palpitations. Appetite good. No N/V.  Objective:   Temp:  [98.2 F (36.8 C)-98.7 F (37.1 C)] 98.3 F (36.8 C) (11/28 0517) Pulse Rate:  [81-145] 88 (11/28 0517) Resp:  [16-20] 16 (11/28 0517) BP: (126-142)/(57-91) 142/91 mmHg (11/28 0517) SpO2:  [90 %-97 %] 94 % (11/28 0517) Weight:  [215 lb 9.8 oz (97.8 kg)-216 lb 14.9 oz (98.4 kg)] 216 lb 14.9 oz (98.4 kg) (11/28 0517) Last BM Date: 05/28/14  Filed Weights   05/28/14 1048 05/28/14 1100 05/29/14 0517  Weight: 215 lb 13.3 oz (97.9 kg) 215 lb 9.8 oz (97.8 kg) 216 lb 14.9 oz (98.4 kg)    Intake/Output Summary (Last 24 hours) at 05/29/14 0819 Last data filed at 05/29/14 0039  Gross per 24 hour  Intake    640 ml  Output   1575 ml  Net   -935 ml    Telemetry: Atrial fibrillation with IVCD.  Exam:  General: Elderly male, appears comfortable.  Lungs: Clear, nonlabored.  Cardiac: Irregular, no gallop.  Abdomen: NABS.  Extremities: Mild leg edema.   Lab Results:  Basic Metabolic Panel:  Recent Labs Lab 05/25/14 0012 05/28/14 1549 05/29/14 0243  NA 140 140 140  K 4.2 3.4* 3.6*  CL 105 103 105  CO2 24 26 23   GLUCOSE 160* 201* 186*  BUN 19 15 14   CREATININE 1.14 1.02 0.93  CALCIUM 7.4* 7.2* 7.2*    CBC:  Recent Labs Lab 05/25/14 0012 05/28/14 0915 05/29/14 0243  WBC 19.5* 16.6* 16.0*  HGB 11.9* 12.6* 12.6*  HCT 36.8* 38.4* 38.9*  MCV 96.8 93.2 93.3  PLT 433* 496* 479*    Coagulation:  Recent Labs Lab 05/28/14 0915 05/29/14 0243  INR 1.46 1.42    Echocardiogram (05/25/2014): Study Conclusions  - Procedure narrative: Transthoracic echocardiography. Image quality was adequate. The study was technically difficult. - Left ventricle: The cavity size was normal. There was mild focal basal hypertrophy of the septum. Systolic function was mildly reduced. The  estimated ejection fraction was in the range of 45% to 50%. There is akinesis of the apicalanteroseptal and apical myocardium. Doppler parameters are consistent with abnormal left ventricular relaxation (grade 1 diastolic dysfunction). - Left atrium: The atrium was mildly dilated.  Impressions:  - Compared to the prior study, there has been no significant interval change.   Medications:   Scheduled Medications: . antiseptic oral rinse  7 mL Mouth Rinse QID  . chlorhexidine  15 mL Mouth Rinse BID  . diltiazem  60 mg Oral 4 times per day  . heparin  5,000 Units Subcutaneous 3 times per day  . hydrocortisone sod succinate (SOLU-CORTEF) inj  25 mg Intravenous Q12H  . metronidazole  500 mg Intravenous Q8H  . warfarin   Does not apply Once  . Warfarin - Pharmacist Dosing Inpatient   Does not apply q1800     PRN Medications: albuterol, diphenhydrAMINE, HYDROcodone-acetaminophen, morphine injection, ondansetron **OR** ondansetron (ZOFRAN) IV, sodium chloride   Assessment:   1. New onset atrial fibrillation, heart rate controlled with oral cardizem. CHADSVASC score 3, on Coumadin per pharmacy with INR 1.4.  2. Cardiomyopathy, stable LVEF 45-50% by recent echocardiogram.  3. Essential hypertension.  4. Diverticulitis of small intestine with perforation with bleeding - POD # 7, per trauma team.   Plan/Discussion:    Will change to cardizem CD 180 mg today. Coumadin per  pharmacy - would continue in case he has persistent atrial fibrillation and eventually needs DCCV later. Restart oral Lasix 40 mg daily - has had significant IVF intake over stay.   Jonelle SidleSamuel G. Evely Gainey, M.D., F.A.C.C.

## 2014-05-30 DIAGNOSIS — Z9889 Other specified postprocedural states: Secondary | ICD-10-CM

## 2014-05-30 DIAGNOSIS — Z9049 Acquired absence of other specified parts of digestive tract: Secondary | ICD-10-CM

## 2014-05-30 LAB — PROTIME-INR
INR: 1.76 — ABNORMAL HIGH (ref 0.00–1.49)
Prothrombin Time: 20.7 seconds — ABNORMAL HIGH (ref 11.6–15.2)

## 2014-05-30 LAB — BASIC METABOLIC PANEL
ANION GAP: 14 (ref 5–15)
BUN: 17 mg/dL (ref 6–23)
CHLORIDE: 102 meq/L (ref 96–112)
CO2: 23 mEq/L (ref 19–32)
CREATININE: 1.04 mg/dL (ref 0.50–1.35)
Calcium: 7.5 mg/dL — ABNORMAL LOW (ref 8.4–10.5)
GFR calc non Af Amer: 66 mL/min — ABNORMAL LOW (ref >90)
GFR, EST AFRICAN AMERICAN: 76 mL/min — AB (ref >90)
Glucose, Bld: 179 mg/dL — ABNORMAL HIGH (ref 70–99)
Potassium: 3.2 mEq/L — ABNORMAL LOW (ref 3.7–5.3)
Sodium: 139 mEq/L (ref 137–147)

## 2014-05-30 LAB — CBC
HCT: 36.5 % — ABNORMAL LOW (ref 39.0–52.0)
Hemoglobin: 12.1 g/dL — ABNORMAL LOW (ref 13.0–17.0)
MCH: 30.7 pg (ref 26.0–34.0)
MCHC: 33.2 g/dL (ref 30.0–36.0)
MCV: 92.6 fL (ref 78.0–100.0)
PLATELETS: 208 10*3/uL (ref 150–400)
RBC: 3.94 MIL/uL — ABNORMAL LOW (ref 4.22–5.81)
RDW: 14.9 % (ref 11.5–15.5)
WBC: 18.2 10*3/uL — AB (ref 4.0–10.5)

## 2014-05-30 MED ORDER — POTASSIUM CHLORIDE CRYS ER 20 MEQ PO TBCR
20.0000 meq | EXTENDED_RELEASE_TABLET | Freq: Every day | ORAL | Status: DC
  Administered 2014-05-30 – 2014-06-01 (×3): 20 meq via ORAL
  Filled 2014-05-30 (×3): qty 1

## 2014-05-30 MED ORDER — PREDNISONE 10 MG PO TABS
10.0000 mg | ORAL_TABLET | Freq: Every day | ORAL | Status: DC
  Administered 2014-05-30 – 2014-06-03 (×5): 10 mg via ORAL
  Filled 2014-05-30 (×8): qty 1

## 2014-05-30 MED ORDER — FUROSEMIDE 10 MG/ML IJ SOLN
40.0000 mg | Freq: Every day | INTRAMUSCULAR | Status: DC
  Administered 2014-05-30: 40 mg via INTRAVENOUS
  Filled 2014-05-30 (×2): qty 4

## 2014-05-30 MED ORDER — WARFARIN SODIUM 3 MG PO TABS
3.0000 mg | ORAL_TABLET | Freq: Once | ORAL | Status: AC
  Administered 2014-05-30: 3 mg via ORAL
  Filled 2014-05-30: qty 1

## 2014-05-30 NOTE — Progress Notes (Signed)
ANTICOAGULATION CONSULT NOTE - Follow-Up Consult  Pharmacy Consult for Coumadin Indication: atrial fibrillation  No Known Allergies  Patient Measurements: Height: 5\' 11"  (180.3 cm) Weight: 221 lb 9 oz (100.5 kg) IBW/kg (Calculated) : 75.3   Vital Signs: Temp: 98.5 F (36.9 C) (11/29 0606) Temp Source: Oral (11/29 0606) BP: 142/93 mmHg (11/29 0606) Pulse Rate: 76 (11/29 0606)  Labs:  Recent Labs  05/28/14 0915 05/28/14 1549 05/29/14 0243 05/30/14 0606  HGB 12.6*  --  12.6* 12.1*  HCT 38.4*  --  38.9* 36.5*  PLT 496*  --  479* 208  LABPROT 17.8*  --  17.5* 20.7*  INR 1.46  --  1.42 1.76*  CREATININE  --  1.02 0.93 1.04    Estimated Creatinine Clearance: 68.4 mL/min (by C-G formula based on Cr of 1.04).   Medical History: Past Medical History  Diagnosis Date  . Hypertension   . Myasthenia gravis   . Cellulitis   . Diverticula of small intestine     Assessment: 80yo male with diverticulitis of small intestine with perforation and bleeding s/p exploratory laparotomy and distal small bowel resection on 11/21. Found to have irregular rate and rhythm on 11/24. Cardiology [facility]([place]) noted pt to have paroxysmal Afib, rate controlled with diltiazem, reluctant to start heparin or NOAC d/t recent surgery, started on warfarin 11/26. INR 1.76  H&H stable, PLT WNL, on heparin SubQ No bleeding noted  Cipro 11/21 >> 11/27 Flagyl 11/21 >>11/27  Goal of Therapy:  INR 2-3 Monitor platelets by anticoagulation protocol: Yes  Plan: -Coumadin 3 mg x 1 -Daily INR -F/U CBC, PLT, s/sx of bleeding  Thanks for allowing pharmacy to be a part of this patient's care.  Talbert CageLora Aldene Hendon, PharmD Clinical Pharmacist, [phone]   05/30/2014 8:55 AM

## 2014-05-30 NOTE — Progress Notes (Signed)
Consulting cardiologist: Dr. Nanetta BattyJonathan [name]  Seen for followup: Atrial fibrillation  Subjective:    Weak. Ambulated to limited degree yesterday with walker. No chest pain or palpitations.  Objective:   Temp:  [98.2 F (36.8 C)-98.5 F (36.9 C)] 98.5 F (36.9 C) (11/29 0606) Pulse Rate:  [76-112] 76 (11/29 0606) Resp:  [18-22] 18 (11/29 0606) BP: (135-142)/(65-93) 142/93 mmHg (11/29 0606) SpO2:  [96 %-97 %] 96 % (11/29 0606) Weight:  [221 lb 9 oz (100.5 kg)] 221 lb 9 oz (100.5 kg) (11/29 0500) Last BM Date: 05/28/14  Filed Weights   05/28/14 1100 05/29/14 0517 05/30/14 0500  Weight: 215 lb 9.8 oz (97.8 kg) 216 lb 14.9 oz (98.4 kg) 221 lb 9 oz (100.5 kg)    Intake/Output Summary (Last 24 hours) at 05/30/14 0824 Last data filed at 05/30/14 0738  Gross per 24 hour  Intake    600 ml  Output    450 ml  Net    150 ml    Telemetry: Atrial fibrillation with IVCD.  Exam:  General: Elderly male, appears comfortable.  Lungs: Clear, nonlabored.  Cardiac: Irregular, no gallop.  Abdomen: NABS.  Extremities: Mild leg edema.   Lab Results:  Basic Metabolic Panel:  Recent Labs Lab 05/28/14 1549 05/29/14 0243 05/30/14 0606  NA 140 140 139  K 3.4* 3.6* 3.2*  CL 103 105 102  CO2 26 23 23   GLUCOSE 201* 186* 179*  BUN 15 14 17   CREATININE 1.02 0.93 1.04  CALCIUM 7.2* 7.2* 7.5*    CBC:  Recent Labs Lab 05/28/14 0915 05/29/14 0243 05/30/14 0606  WBC 16.6* 16.0* 18.2*  HGB 12.6* 12.6* 12.1*  HCT 38.4* 38.9* 36.5*  MCV 93.2 93.3 92.6  PLT 496* 479* 208    Coagulation:  Recent Labs Lab 05/28/14 0915 05/29/14 0243 05/30/14 0606  INR 1.46 1.42 1.76*    Echocardiogram (05/25/2014): Study Conclusions  - Procedure narrative: Transthoracic echocardiography. Image quality was adequate. The study was technically difficult. - Left ventricle: The cavity size was normal. There was mild focal basal hypertrophy of the septum. Systolic function was  mildly reduced. The estimated ejection fraction was in the range of 45% to 50%. There is akinesis of the apicalanteroseptal and apical myocardium. Doppler parameters are consistent with abnormal left ventricular relaxation (grade 1 diastolic dysfunction). - Left atrium: The atrium was mildly dilated.  Impressions:  - Compared to the prior study, there has been no significant interval change.   Medications:   Scheduled Medications: . antiseptic oral rinse  7 mL Mouth Rinse QID  . chlorhexidine  15 mL Mouth Rinse BID  . diltiazem  180 mg Oral Daily  . furosemide  40 mg Oral Daily  . heparin  5,000 Units Subcutaneous 3 times per day  . predniSONE  10 mg Oral Q breakfast  . warfarin   Does not apply Once  . Warfarin - Pharmacist Dosing Inpatient   Does not apply q1800     PRN Medications: albuterol, diphenhydrAMINE, HYDROcodone-acetaminophen, morphine injection, ondansetron **OR** ondansetron (ZOFRAN) IV, sodium chloride   Assessment:   1. New onset atrial fibrillation, heart rate controlled with oral cardizem. CHADSVASC score 3, on Coumadin per pharmacy with up to INR 1.7.  2. Cardiomyopathy, stable LVEF 45-50% by recent echocardiogram. Back on oral Lasix.  3. Essential hypertension.  4. Diverticulitis of small intestine with perforation with bleeding - POD # 8, per trauma team.   Plan/Discussion:    Continue cardizem CD 180 mg daily.  Coumadin per pharmacy - would continue in case he has persistent atrial fibrillation and eventually needs DCCV later. Putting back on IV Lasix 40 mg daily - still has significant volume to address. Replete potassium.  [name], M.D., F.A.C.C.

## 2014-05-30 NOTE — Progress Notes (Signed)
Patient ID: [name], male   DOB: 1932-03-24, 80 y.o.   MRN: [id number] [facility] Surgery Progress Note:   8 Days Post-Op  Subjective: Mental status is clear.  Slow improvement Objective: Vital signs in last 24 hours: Temp:  [98.2 F (36.8 C)-98.5 F (36.9 C)] 98.5 F (36.9 C) (11/29 0606) Pulse Rate:  [76-112] 76 (11/29 0606) Resp:  [18-22] 18 (11/29 0606) BP: (135-142)/(65-93) 142/93 mmHg (11/29 0606) SpO2:  [96 %-97 %] 96 % (11/29 0606) Weight:  [221 lb 9 oz (100.5 kg)] 221 lb 9 oz (100.5 kg) (11/29 0500)  Intake/Output from previous day: 11/28 0701 - 11/29 0700 In: 600 [P.O.:600] Out: 350 [Urine:350] Intake/Output this shift: Total I/O In: -  Out: 100 [Urine:100]  Physical Exam: Work of breathing is not labored.  Incision is packed and healing slowly.  Not infected.    Lab Results:  Results for orders placed or performed during the hospital encounter of 05/22/14 (from the past 48 hour(s))  Protime-INR     Status: Abnormal   Collection Time: 05/28/14  9:15 AM  Result Value Ref Range   Prothrombin Time 17.8 (H) 11.6 - 15.2 seconds   INR 1.46 0.00 - 1.49  CBC     Status: Abnormal   Collection Time: 05/28/14  9:15 AM  Result Value Ref Range   WBC 16.6 (H) 4.0 - 10.5 K/uL   RBC 4.12 (L) 4.22 - 5.81 MIL/uL   Hemoglobin 12.6 (L) 13.0 - 17.0 g/dL   HCT 38.4 (L) 39.0 - 52.0 %   MCV 93.2 78.0 - 100.0 fL   MCH 30.6 26.0 - 34.0 pg   MCHC 32.8 30.0 - 36.0 g/dL   RDW 14.8 11.5 - 15.5 %   Platelets 496 (H) 150 - 400 K/uL  Basic metabolic panel     Status: Abnormal   Collection Time: 05/28/14  3:49 PM  Result Value Ref Range   Sodium 140 137 - 147 mEq/L   Potassium 3.4 (L) 3.7 - 5.3 mEq/L   Chloride 103 96 - 112 mEq/L   CO2 26 19 - 32 mEq/L   Glucose, Bld 201 (H) 70 - 99 mg/dL   BUN 15 6 - 23 mg/dL   Creatinine, Ser 1.02 0.50 - 1.35 mg/dL   Calcium 7.2 (L) 8.4 - 10.5 mg/dL   GFR calc non Af Amer 67 (L) >90 mL/min   GFR calc Af Amer 78 (L) >90 mL/min   Comment: (NOTE) The eGFR has been calculated using the CKD EPI equation. This calculation has not been validated in all clinical situations. eGFR's persistently <90 mL/min signify possible Chronic Kidney Disease.    Anion gap 11 5 - 15  Clostridium Difficile by PCR     Status: None   Collection Time: 05/28/14  8:00 PM  Result Value Ref Range   C difficile by pcr NEGATIVE NEGATIVE  Protime-INR     Status: Abnormal   Collection Time: 05/29/14  2:43 AM  Result Value Ref Range   Prothrombin Time 17.5 (H) 11.6 - 15.2 seconds   INR 1.42 0.00 - 1.49  CBC     Status: Abnormal   Collection Time: 05/29/14  2:43 AM  Result Value Ref Range   WBC 16.0 (H) 4.0 - 10.5 K/uL   RBC 4.17 (L) 4.22 - 5.81 MIL/uL   Hemoglobin 12.6 (L) 13.0 - 17.0 g/dL   HCT 38.9 (L) 39.0 - 52.0 %   MCV 93.3 78.0 - 100.0 fL   MCH 30.2  26.0 - 34.0 pg   MCHC 32.4 30.0 - 36.0 g/dL   RDW 14.8 11.5 - 15.5 %   Platelets 479 (H) 150 - 400 K/uL  Basic metabolic panel     Status: Abnormal   Collection Time: 05/29/14  2:43 AM  Result Value Ref Range   Sodium 140 137 - 147 mEq/L   Potassium 3.6 (L) 3.7 - 5.3 mEq/L   Chloride 105 96 - 112 mEq/L   CO2 23 19 - 32 mEq/L   Glucose, Bld 186 (H) 70 - 99 mg/dL   BUN 14 6 - 23 mg/dL   Creatinine, Ser 0.93 0.50 - 1.35 mg/dL   Calcium 7.2 (L) 8.4 - 10.5 mg/dL   GFR calc non Af Amer 77 (L) >90 mL/min   GFR calc Af Amer 90 (L) >90 mL/min    Comment: (NOTE) The eGFR has been calculated using the CKD EPI equation. This calculation has not been validated in all clinical situations. eGFR's persistently <90 mL/min signify possible Chronic Kidney Disease.    Anion gap 12 5 - 15  Protime-INR     Status: Abnormal   Collection Time: 05/30/14  6:06 AM  Result Value Ref Range   Prothrombin Time 20.7 (H) 11.6 - 15.2 seconds   INR 1.76 (H) 0.00 - 1.49  CBC     Status: Abnormal   Collection Time: 05/30/14  6:06 AM  Result Value Ref Range   WBC 18.2 (H) 4.0 - 10.5 K/uL   RBC 3.94 (L)  4.22 - 5.81 MIL/uL   Hemoglobin 12.1 (L) 13.0 - 17.0 g/dL   HCT 36.5 (L) 39.0 - 52.0 %   MCV 92.6 78.0 - 100.0 fL   MCH 30.7 26.0 - 34.0 pg   MCHC 33.2 30.0 - 36.0 g/dL   RDW 14.9 11.5 - 15.5 %   Platelets 208 150 - 400 K/uL    Comment: REPEATED TO VERIFY  Basic metabolic panel     Status: Abnormal   Collection Time: 05/30/14  6:06 AM  Result Value Ref Range   Sodium 139 137 - 147 mEq/L   Potassium 3.2 (L) 3.7 - 5.3 mEq/L   Chloride 102 96 - 112 mEq/L   CO2 23 19 - 32 mEq/L   Glucose, Bld 179 (H) 70 - 99 mg/dL   BUN 17 6 - 23 mg/dL   Creatinine, Ser 1.04 0.50 - 1.35 mg/dL   Calcium 7.5 (L) 8.4 - 10.5 mg/dL   GFR calc non Af Amer 66 (L) >90 mL/min   GFR calc Af Amer 76 (L) >90 mL/min    Comment: (NOTE) The eGFR has been calculated using the CKD EPI equation. This calculation has not been validated in all clinical situations. eGFR's persistently <90 mL/min signify possible Chronic Kidney Disease.    Anion gap 14 5 - 15    Radiology/Results: No results found.  Anti-infectives: Anti-infectives    Start     Dose/Rate Route Frequency Ordered Stop   05/22/14 0900  ciprofloxacin (CIPRO) IVPB 400 mg     400 mg200 mL/hr over 60 Minutes Intravenous Every 12 hours 05/22/14 0821 05/28/14 2247   05/22/14 0900  metroNIDAZOLE (FLAGYL) IVPB 500 mg     500 mg100 mL/hr over 60 Minutes Intravenous Every 8 hours 05/22/14 0821 05/29/14 0859   05/22/14 0300  ciprofloxacin (CIPRO) IVPB 400 mg     400 mg200 mL/hr over 60 Minutes Intravenous  Once 05/22/14 0253 05/22/14 0434   05/22/14 0300  metroNIDAZOLE (FLAGYL) IVPB 500  mg     500 mg100 mL/hr over 60 Minutes Intravenous  Once 05/22/14 0253 05/22/14 0434      Assessment/Plan: Problem List: Patient Active Problem List   Diagnosis Date Noted  . S/P small bowel resection Nov 2015 05/30/2014  . PAF (paroxysmal atrial fibrillation)   . Cellulitis of hand 12/13/2011  . 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 HTN (hypertension) 12/13/2011    Will  discontinue solumedrol and convert back to oral prednisone.  Deconditioned.  Taking food slowly.   8 Days Post-Op    LOS: 8 days   Matt B. Hassell Done, MD, [facility] [phone] beeper [phone]  05/30/2014 8:06 AM

## 2014-05-30 NOTE — Discharge Instructions (Addendum)
Patient should get daily CBCs and BMETs to follow platelet count and Creatinine per cardiology.  If his platelets fall below 60K, his coumadin should be held.    [facility]      [facility], [place] [phone]  OPEN ABDOMINAL SURGERY: POST OP INSTRUCTIONS  Always review your discharge instruction sheet given to you by the facility where your surgery was performed.  IF YOU HAVE DISABILITY OR FAMILY LEAVE FORMS, YOU MUST BRING THEM TO THE OFFICE FOR PROCESSING.  PLEASE DO NOT GIVE THEM TO YOUR DOCTOR.  1. A prescription for pain medication may be given to you upon discharge.  Take your pain medication as prescribed, if needed.  If narcotic pain medicine is not needed, then you may take acetaminophen (Tylenol) or ibuprofen (Advil) as needed. 2. Take your usually prescribed medications unless otherwise directed. 3. If you need a refill on your pain medication, please contact your pharmacy. They will contact our office to request authorization.  Prescriptions will not be filled after 5pm or on week-ends. 4. You should follow a light diet the first few days after arrival home, such as soup and crackers, pudding, etc.unless your doctor has advised otherwise. A high-fiber, low fat diet can be resumed as tolerated.   Be sure to include lots of fluids daily. Most patients will experience some swelling and bruising on the chest and neck area.  Ice packs will help.  Swelling and bruising can take several days to resolve 5. Most patients will experience some swelling and bruising in the area of the incision. Ice pack will help. Swelling and bruising can take several days to resolve..  6. It is common to experience some constipation if taking pain medication after surgery.  Increasing fluid intake and taking a stool softener will usually help or prevent this problem from occurring.  A mild laxative (Milk of Magnesia or Miralax) should be taken according to package directions if there are no bowel movements  after 48 hours. 7.  You may have steri-strips (small skin tapes) in place directly over the incision.  These strips should be left on the skin for 7-10 days.  If your surgeon used skin glue on the incision, you may shower in 24 hours.  The glue will flake off over the next 2-3 weeks.  Any sutures or staples will be removed at the office during your follow-up visit. You may find that a light gauze bandage over your incision may keep your staples from being rubbed or pulled. You may shower and replace the bandage daily. 8. ACTIVITIES:  You may resume regular (light) daily activities beginning the next day--such as daily self-care, walking, climbing stairs--gradually increasing activities as tolerated.  You may have sexual intercourse when it is comfortable.  Refrain from any heavy lifting or straining until approved by your doctor. a. You may drive when you no longer are taking prescription pain medication, you can comfortably wear a seatbelt, and you can safely maneuver your car and apply brakes b. Return to Work: ___________________________________ 9. You should see your doctor in the office for a follow-up appointment approximately two weeks after your surgery.  Make sure that you call for this appointment within a day or two after you arrive home to insure a convenient appointment time. OTHER INSTRUCTIONS:  _____________________________________________________________ _____________________________________________________________  WHEN TO CALL YOUR DOCTOR: 1. Fever over 101.0 2. Inability to urinate 3. Nausea and/or vomiting 4. Extreme swelling or bruising 5. Continued bleeding from incision. 6. Increased pain, redness, or drainage from  the incision. 7. Difficulty swallowing or breathing 8. Muscle cramping or spasms. 9. Numbness or tingling in hands or feet or around lips.  The clinic staff is available to answer your questions during regular business hours.  Please dont hesitate to call and  ask to speak to one of the nurses if you have concerns.  For further questions, please visit [email or web address]  Dressing Change A dressing is a material placed over wounds. It keeps the wound clean, dry, and protected from further injury. This provides an environment that favors wound healing.  BEFORE YOU BEGIN  Get your supplies together. Things you may need include:  Saline solution.  Flexible gauze dressing.  Medicated cream.  Tape.  Gloves.  Abdominal dressing pads.  Gauze squares.  Plastic bags.  Take pain medicine 30 minutes before the dressing change if you need it.  Take a shower before you do the first dressing change of the day. Use plastic wrap or a plastic bag to prevent the dressing from getting wet. REMOVING YOUR OLD DRESSING   Wash your hands with soap and water. Dry your hands with a clean towel.  Put on your gloves.  Remove any tape.  Carefully remove the old dressing. If the dressing sticks, you may dampen it with warm water to loosen it, or follow your caregiver's specific directions.  Remove any gauze or packing tape that is in your wound.  Take off your gloves.  Put the gloves, tape, gauze, or any packing tape into a plastic bag. CHANGING YOUR DRESSING  Open the supplies.  Take the cap off the saline solution.  Open the gauze package so that the gauze remains on the inside of the package.  Put on your gloves.  Clean your wound as told by your caregiver.  If you have been told to keep your wound dry, follow those instructions.  Your caregiver may tell you to do one or more of the following:  Pick up the gauze. Pour the saline solution over the gauze. Squeeze out the extra saline solution.  Put medicated cream or other medicine on your wound if you have been told to do so.  Put the solution soaked gauze only in your wound, not on the skin around it.  Pack your wound loosely or as told by your caregiver.  Put dry gauze  on your wound.  Put abdominal dressing pads over the dry gauze if your wet gauze soaks through.  Tape the abdominal dressing pads in place so they will not fall off. Do not wrap the tape completely around the affected part (arm, leg, abdomen).  Wrap the dressing pads with a flexible gauze dressing to secure it in place.  Take off your gloves. Put them in the plastic bag with the old dressing. Tie the bag shut and throw it away.  Keep the dressing clean and dry until your next dressing change.  Wash your hands. SEEK MEDICAL CARE IF:  Your skin around the wound looks red.  Your wound feels more tender or sore.  You see pus in the wound.  Your wound smells bad.  You have a fever.  Your skin around the wound has a rash that itches and burns.  You see black or yellow skin in your wound that was not there before.  You feel nauseous, throw up, and feel very tired. Document Released: 07/26/2004 Document Revised: 09/10/2011 Document Reviewed: 04/30/2011 [facility] Patient Information 2015 [place] River[place]Care, [place]. This information is not intended to replace advice given to  you by your health care provider. Make sure you discuss any questions you have with your health care provider.   Warfarin: What You Need to Know Warfarin is an anticoagulant. Anticoagulants help prevent the formation of blood clots. They also help stop the growth of blood clots. Warfarin is sometimes referred to as a "blood thinner."  Normally, when body tissues are cut or damaged, the blood clots in order to prevent blood loss. Sometimes clots form inside your blood vessels and obstruct the flow of blood through your circulatory system (thrombosis). These clots may travel through your bloodstream and become lodged in smaller blood vessels in your brain, which can cause a stroke, or in your lungs (pulmonary embolism). WHO SHOULD USE WARFARIN? Warfarin is prescribed for people at risk of developing harmful blood clots:  People  with surgically implanted mechanical heart valves, irregular heart rhythms called atrial fibrillation, and certain clotting disorders.  People who have developed harmful blood clotting in the past, including those who have had a stroke or a pulmonary embolism, or thrombosis in their legs (deep vein thrombosis [DVT]).  People with an existing blood clot, such as a pulmonary embolism. WARFARIN DOSING Warfarin tablets come in different strengths. Each tablet strength is a different color, with the amount of warfarin (in milligrams) clearly printed on the tablet. If the color of your tablet is different than usual when you receive a new prescription, report it immediately to your pharmacist or health care provider. WARFARIN MONITORING The goal of warfarin therapy is to lessen the clotting tendency of blood but not prevent clotting completely. Your health care provider will monitor the anticoagulation effect of warfarin closely and adjust your dose as needed. For your safety, blood tests called prothrombin time (PT) or international normalized ratio (INR) are used to measure the effects of warfarin. Both of these tests can be done with a finger stick or a blood draw. The longer it takes the blood to clot, the higher the PT or INR. Your health care provider will inform you of your "target" PT or INR range. If, at any time, your PT or INR is above the target range, there is a risk of bleeding. If your PT or INR is below the target range, there is a risk of clotting. Whether you are started on warfarin while you are in the hospital or in your health care provider's office, you will need to have your PT or INR checked within one week of starting the medicine. Initially, some people are asked to have their PT or INR checked as much as twice a week. Once you are on a stable maintenance dose, the PT or INR is checked less often, usually once every 2 to 4 weeks. The warfarin dose may be adjusted if the PT or INR is  not within the target range. It is important to keep all laboratory and health care provider follow-up appointments. Not keeping appointments could result in a chronic or permanent injury, pain, or disability because warfarin is a medicine that requires close monitoring. WHAT ARE THE SIDE EFFECTS OF WARFARIN?  Too much warfarin can cause bleeding (hemorrhage) from any part of the body. This may include bleeding from the gums, blood in the urine, bloody or dark stools, a nosebleed that is not easily stopped, coughing up blood, or vomiting blood.  Too little warfarin can increase the risk of blood clots.  Too little or too much warfarin can also increase the risk of a stroke.  Warfarin use may  cause a skin rash or irritation, an unusual fever, continual nausea or stomach upset, or severe pain in your joints or back. SPECIAL PRECAUTIONS WHILE TAKING WARFARIN Warfarin should be taken exactly as directed. It is very important to take warfarin as directed since bleeding or blood clots could result in chronic or permanent injury, pain, or disability.  Take your medicine at the same time every day. If you forget to take your dose, you can take it if it is within 6 hours of when it was due.  Do not change the dose of warfarin on your own to make up for missed or extra doses.  If you miss more than 2 doses in a row, you should contact your health care provider for advice. Avoid situations that cause bleeding. You may have a tendency to bleed more easily than usual while taking warfarin. The following actions can limit bleeding:  Using a softer toothbrush.  Flossing with waxed floss rather than unwaxed floss.  Shaving with an Neurosurgeon rather than a blade.  Limiting the use of sharp objects.  Avoiding potentially harmful activities, such as contact sports. Warfarin and Pregnancy or Breastfeeding  Warfarin is not advised during the first trimester of pregnancy due to an increased risk of  birth defects. In certain situations, a woman may take warfarin after her first trimester of pregnancy. A woman who becomes pregnant or plans to become pregnant while taking warfarin should notify her health care provider immediately.  Although warfarin does not pass into breast milk, a woman who wishes to breastfeed while taking warfarin should also consult with her health care provider. Alcohol, Smoking, and Illicit Drug Use  Alcohol affects how warfarin works in the body. It is best to avoid alcoholic drinks or consume very small amounts while taking warfarin. In general, alcohol intake should be limited to 1 oz (30 mL) of liquor, 6 oz (180 mL) of wine, or 12 oz (360 mL) of beer each day. Notify your health care provider if you change your alcohol intake.  Smoking affects how warfarin works. It is best to avoid smoking while taking warfarin. Notify your health care provider if you change your smoking habits.  It is best to avoid all illicit drugs while taking warfarin since there are few studies that show how warfarin interacts with these drugs. Other Medicines and Dietary Supplements Many prescription and over-the-counter medicines can interfere with warfarin. Be sure all of your health care providers know you are taking warfarin. Notify your health care provider who prescribed warfarin for you or your pharmacist before starting or stopping any new medicines, including over-the-counter vitamins, dietary supplements, and pain medicines. Your warfarin dose may need to be adjusted. Some common over-the-counter medicines that may increase the risk of bleeding while taking warfarin include:   Acetaminophen.  Aspirin.  Nonsteroidal anti-inflammatory medicines (NSAIDs), such as ibuprofen or naproxen.  Vitamin E. Dietary Considerations  Foods that have moderate or high amounts of vitamin K can interfere with warfarin. Avoid major changes in your diet or notify your health care provider before  changing your diet. Eat a consistent amount of foods that have moderate or high amounts of vitamin K. Eating less foods containing vitamin K can increase the risk of bleeding. Eating more foods containing vitamin K can increase the risk of blood clots. Additional questions about dietary considerations can be discussed with a dietitian. Foods that are very high in vitamin K:  Greens, such as Swiss chard and beet, collard, mustard, or  turnip greens (fresh or frozen, cooked).  Kale (fresh or frozen, cooked).  Parsley (raw).  Spinach (cooked). Foods that are high in vitamin K:  Asparagus (frozen, cooked).  Beans, green (frozen, cooked).  Broccoli.  Bok choy (cooked).  Brussels sprouts (fresh or frozen, cooked).  Cabbage (cooked).   Coleslaw. Foods that are moderately high in vitamin K:  Blueberries.  Black-eyed peas.  Endive (raw).  Green leaf lettuce (raw).  Green scallions (raw).  Kale (raw).  Okra (frozen, cooked).  Plantains (fried).  Romaine lettuce (raw).  Sauerkraut (canned).  Spinach (raw). CALL YOUR CLINIC OR HEALTH CARE PROVIDER IF YOU:  Plan to have any surgery or procedure.  Feel sick, especially if you have diarrhea or vomiting.  Experience or anticipate any major changes in your diet.  Start or stop a prescription or over-the-counter medicine.  Become, plan to become, or think you may be pregnant.  Are having heavier than usual menstrual periods.  Have had a fall, accident, or any symptoms of bleeding or unusual bruising.  Develop an unusual fever. CALL 911 IN THE [place] OR GO TO THE EMERGENCY DEPARTMENT IF YOU:   Think you may be having an allergic reaction to warfarin. The signs of an allergic reaction could include itching, rash, hives, swelling, chest tightness, or trouble breathing.  See signs of blood in your urine. The signs could include reddish, pinkish, or tea-colored urine.  See signs of blood in your stools. The signs could  include bright red or black stools.  Vomit or cough up blood. In these instances, the blood could have either a bright red or a "coffee-grounds" appearance.  Have bleeding that will not stop after applying pressure for 30 minutes such as cuts, nosebleeds, or other injuries.  Have severe pain in your joints or back.  Have a new and severe headache.  Have sudden weakness or numbness of your face, arm, or leg, especially on one side of your body.  Have sudden confusion or trouble understanding.  Have sudden trouble seeing in one or both eyes.  Have sudden trouble walking, dizziness, loss of balance, or coordination.  Have trouble speaking or understanding (aphasia). Document Released: 06/18/2005 Document Revised: 11/02/2013 Document Reviewed: 12/12/2012 [facility]idExitCare Patient Information 2015 [place] HallExitCare, [place]LLC. This information is not intended to replace advice given to you by your health care provider. Make sure you discuss any questions you have with your health care provider.

## 2014-05-31 DIAGNOSIS — I481 Persistent atrial fibrillation: Secondary | ICD-10-CM

## 2014-05-31 LAB — CBC
HCT: 38.6 % — ABNORMAL LOW (ref 39.0–52.0)
Hemoglobin: 12.5 g/dL — ABNORMAL LOW (ref 13.0–17.0)
MCH: 30 pg (ref 26.0–34.0)
MCHC: 32.4 g/dL (ref 30.0–36.0)
MCV: 92.6 fL (ref 78.0–100.0)
Platelets: 177 10*3/uL (ref 150–400)
RBC: 4.17 MIL/uL — ABNORMAL LOW (ref 4.22–5.81)
RDW: 15.1 % (ref 11.5–15.5)
WBC: 21.2 10*3/uL — ABNORMAL HIGH (ref 4.0–10.5)

## 2014-05-31 LAB — BASIC METABOLIC PANEL
Anion gap: 13 (ref 5–15)
BUN: 18 mg/dL (ref 6–23)
CALCIUM: 7.7 mg/dL — AB (ref 8.4–10.5)
CO2: 25 mEq/L (ref 19–32)
Chloride: 103 mEq/L (ref 96–112)
Creatinine, Ser: 0.96 mg/dL (ref 0.50–1.35)
GFR calc Af Amer: 88 mL/min — ABNORMAL LOW (ref >90)
GFR calc non Af Amer: 76 mL/min — ABNORMAL LOW (ref >90)
GLUCOSE: 213 mg/dL — AB (ref 70–99)
Potassium: 3.8 mEq/L (ref 3.7–5.3)
Sodium: 141 mEq/L (ref 137–147)

## 2014-05-31 LAB — PROTIME-INR
INR: 2.24 — ABNORMAL HIGH (ref 0.00–1.49)
Prothrombin Time: 24.9 seconds — ABNORMAL HIGH (ref 11.6–15.2)

## 2014-05-31 MED ORDER — FUROSEMIDE 10 MG/ML IJ SOLN
40.0000 mg | Freq: Two times a day (BID) | INTRAMUSCULAR | Status: DC
  Administered 2014-05-31 – 2014-06-01 (×2): 40 mg via INTRAVENOUS
  Filled 2014-05-31 (×2): qty 4

## 2014-05-31 MED ORDER — WARFARIN SODIUM 3 MG PO TABS
3.0000 mg | ORAL_TABLET | Freq: Once | ORAL | Status: AC
  Administered 2014-05-31: 3 mg via ORAL
  Filled 2014-05-31: qty 1

## 2014-05-31 MED ORDER — NYSTATIN 100000 UNIT/GM EX POWD
Freq: Two times a day (BID) | CUTANEOUS | Status: DC
  Administered 2014-05-31 – 2014-06-03 (×6): via TOPICAL
  Filled 2014-05-31: qty 15

## 2014-05-31 MED ORDER — METOPROLOL TARTRATE 25 MG PO TABS
25.0000 mg | ORAL_TABLET | Freq: Three times a day (TID) | ORAL | Status: DC
  Administered 2014-05-31 – 2014-06-01 (×3): 25 mg via ORAL
  Filled 2014-05-31 (×5): qty 1

## 2014-05-31 MED ORDER — DILTIAZEM HCL ER COATED BEADS 240 MG PO CP24
240.0000 mg | ORAL_CAPSULE | Freq: Every day | ORAL | Status: DC
  Administered 2014-06-01: 240 mg via ORAL
  Filled 2014-05-31: qty 1

## 2014-05-31 NOTE — Progress Notes (Signed)
Physical Therapy Treatment Patient Details Name: Erik GoodellCharlie W Brymer MRN: [id number] DOB: 10/13/1931 Today's Date: 05/31/2014    History of Present Illness 80 yo WM came to ED for persistent and worsening generalized abdominal pain. Found to have diverticulitis with small intestine perforation s/p exp lap with small bowel resection 11/21. Pt with hx of myasthenia gravis. New onset Afib and scrotal edema    PT Comments    Patient progressing slowly with mobility. Ambulation distance limited today due to decreased 02 and elevated HR. Increased time to perform all transfers and mobility with multiple seated rest breaks btw transitions due to fatigue and dyspnea. Required Mod A to stand from low surfaces. Fatigues quickly. Discharge recommendation updated to [facility] SNF as pt not safe to return home at this time. If pt progresses with mobility in hospital, will be able to return home with 24/7 supervision and HHPT. Will continue to follow acutely.   Follow Up Recommendations  SNF;Supervision/Assistance - 24 hour If pt progresses in acute PT, will be able to return home with 24/7 and HHPT.     Equipment Recommendations  Rolling walker with 5" wheels    Recommendations for Other Services       Precautions / Restrictions Precautions Precautions: Fall Restrictions Weight Bearing Restrictions: No    Mobility  Bed Mobility Overal bed mobility: Needs Assistance Bed Mobility: Supine to Sit     Supine to sit: Mod assist     General bed mobility comments: Mod A to elevate trunk to EOB. Use of rails. Cues to roll to side however pt doing transfer his own way.  Transfers Overall transfer level: Needs assistance Equipment used: Rolling walker (2 wheeled) Transfers: Sit to/from Stand Sit to Stand: Mod assist;Min assist;+2 physical assistance         General transfer comment: Min A to stand from EOB; Mod A of 2 to stand from toilet with grab bar due to low surface. VC's for hand placement.  Transferred to chair-cues for controlled descent into chair.  Ambulation/Gait Ambulation/Gait assistance: Min assist Ambulation Distance (Feet): 15 Feet (x2 bouts) Assistive device: Rolling walker (2 wheeled) Gait Pattern/deviations: Step-through pattern;Wide base of support;Decreased stride length;Trunk flexed   Gait velocity interpretation: <1.8 ft/sec, indicative of risk for recurrent falls General Gait Details: Slow and unsteady gait with VC's for upright posture and pursed lip breathing. Sa02 decreased to 88% on 3L 02 [place]. HR increased to 152 bpm. Dyspnea present.   Stairs            Wheelchair Mobility    Modified Rankin (Stroke Patients Only)       Balance Overall balance assessment: Needs assistance Sitting-balance support: Feet supported;Single extremity supported Sitting balance-Leahy Scale: Fair     Standing balance support: During functional activity;Bilateral upper extremity supported Standing balance-Leahy Scale: Poor Standing balance comment: Requires BUE support on RW for balance/safety. Total A for peri care in standing with Min A for balance.                    Cognition Arousal/Alertness: Awake/alert Behavior During Therapy: WFL for tasks assessed/performed Overall Cognitive Status: Within Functional Limits for tasks assessed                      Exercises      General Comments        Pertinent Vitals/Pain Pain Assessment: No/denies pain    Home Living  Prior Function            PT Goals (current goals can now be found in the care plan section) Progress towards PT goals: Progressing toward goals    Frequency  Min 3X/week    PT Plan Discharge plan needs to be updated    Co-evaluation             End of Session Equipment Utilized During Treatment: Gait belt Activity Tolerance: Treatment limited secondary to medical complications (Comment) Patient left: in chair;with call  bell/phone within reach;with nursing/sitter in room (Tech present in room to perform bath.)     Time: [id number] PT Time Calculation (min) (ACUTE ONLY): 32 min  Charges:  $Gait Training: 8-22 mins $Therapeutic Activity: 8-22 mins                    G CodesAlvie Heidelberg:      Folan, Macil Crady A 05/31/2014, 12:10 PM  Alvie HeidelbergShauna Folan, PT, DPT [phone]

## 2014-05-31 NOTE — Progress Notes (Signed)
Patient Profile: 80 y/o male admitted 11/21 with abd pain. CT findings c/w perforated diverticulitis and clinical picture concerning for worsening peritonitis.  Now POD #9 exploratory lap and SB resection. Cardiology consulted 11/24 for new onset AFIB with RVR. 2D echo with EF 45-50% and G1DD. No significant interval change since prior study. Now on PO Cardizem for rate control and Wafarin for stroke prophylaxis.   Subjective: SOB  No CP    Objective: Vital signs in last 24 hours: Temp:  [98 F (36.7 C)-98.9 F (37.2 C)] 98.9 F (37.2 C) (11/30 0555) Pulse Rate:  [59-102] 94 (11/30 0555) Resp:  [16-18] 16 (11/30 0555) BP: (122-156)/(74-80) 156/80 mmHg (11/30 0555) SpO2:  [94 %-95 %] 95 % (11/30 0555) Weight:  [222 lb 7.1 oz (100.9 kg)] 222 lb 7.1 oz (100.9 kg) (11/30 0500) Last BM Date: 05/28/14  Intake/Output from previous day: 11/29 0701 - 11/30 0700 In: 730 [P.O.:720; I.V.:10] Out: 900 [Urine:900] Intake/Output this shift: Total I/O In: -  Out: 100 [Urine:100]  Medications Current Facility-Administered Medications  Medication Dose Route Frequency Provider Last Rate Last Dose  . albuterol (PROVENTIL) (2.5 MG/3ML) 0.083% nebulizer solution 2.5 mg  2.5 mg Nebulization Q3H PRN Bernadene PersonKathryn A Whiteheart, NP      . antiseptic oral rinse (CPC / CETYLPYRIDINIUM CHLORIDE 0.05%) solution 7 mL  7 mL Mouth Rinse QID Alyson ReedyWesam G Yacoub, MD   7 mL at 05/31/14 0401  . chlorhexidine (PERIDEX) 0.12 % solution 15 mL  15 mL Mouth Rinse BID Alyson ReedyWesam G Yacoub, MD   15 mL at 05/31/14 0900  . diltiazem (CARDIZEM CD) 24 hr capsule 180 mg  180 mg Oral Daily Jonelle SidleSamuel G McDowell, MD   180 mg at 05/31/14 1007  . diphenhydrAMINE (BENADRYL) capsule 25 mg  25 mg Oral Q6H PRN Harriette Bouillonhomas Cornett, MD   25 mg at 05/30/14 2006  . furosemide (LASIX) injection 40 mg  40 mg Intravenous BID Barnetta ChapelKelly Osborne, PA-C      . HYDROcodone-acetaminophen (NORCO/VICODIN) 5-325 MG per tablet 1-2 tablet  1-2 tablet Oral Q4H PRN Megan N Dort, PA-C       . morphine 2 MG/ML injection 1-3 mg  1-3 mg Intravenous Q4H PRN Megan N Dort, PA-C      . nystatin (MYCOSTATIN/NYSTOP) topical powder   Topical BID Barnetta ChapelKelly Osborne, PA-C   Stopped at 05/31/14 1030  . ondansetron (ZOFRAN) tablet 4 mg  4 mg Oral Q6H PRN Atilano InaEric M Wilson, MD       Or  . ondansetron [facility](ZOFRAN) injection 4 mg  4 mg Intravenous Q6H PRN Atilano InaEric M Wilson, MD   4 mg at 05/26/14 2109  . potassium chloride SA (K-DUR,KLOR-CON) CR tablet 20 mEq  20 mEq Oral Daily Jonelle SidleSamuel G McDowell, MD   20 mEq at 05/31/14 1005  . predniSONE (DELTASONE) tablet 10 mg  10 mg Oral Q breakfast Valarie MerinoMatthew B Martin, MD   10 mg at 05/31/14 0859  . sodium chloride 0.9 % injection 10-40 mL  10-40 mL Intracatheter PRN Atilano InaEric M Wilson, MD   10 mL at 05/30/14 1700  . warfarin (COUMADIN) tablet 3 mg  3 mg Oral ONCE-1800 Candis SchatzBenjamin G Mancheril, RPH      . warfarin (COUMADIN) video   Does not apply Once Waynette Butteryegan K Magsam, RPH   Stopped at 05/28/14 1130  . Warfarin - Pharmacist Dosing Inpatient   Does not apply q1800 Tegan K Magsam, RPH        PE: General appearance: alert, cooperative and no distress  Neck: no carotid bruit Lungs: clear to auscultation bilaterally Heart: irregularly irregular rhythm and tachy rate Extremities: 2+ upper and lower extremity edema Pulses: 2+ and symmetric Skin: warm and dry Neurologic: Grossly normal  Lab Results:   Recent Labs  05/29/14 0243 05/30/14 0606 05/31/14 1015  WBC 16.0* 18.2* 21.2*  HGB 12.6* 12.1* 12.5*  HCT 38.9* 36.5* 38.6*  PLT 479* 208 177   BMET  Recent Labs  05/29/14 0243 05/30/14 0606 05/31/14 0540  NA 140 139 141  K 3.6* 3.2* 3.8  CL 105 102 103  CO2 23 23 25   GLUCOSE 186* 179* 213*  BUN 14 17 18   CREATININE 0.93 1.04 0.96  CALCIUM 7.2* 7.5* 7.7*   PT/INR  Recent Labs  05/29/14 0243 05/30/14 0606 05/31/14 0540  LABPROT 17.5* 20.7* 24.9*  INR 1.42 1.76* 2.24*    Assessment/Plan  Active Problems:   HTN (hypertension)   PAF (paroxysmal atrial  fibrillation)   S/P small bowel resection Nov 2015  1. Atrial Fibrillation: Rates are not controlled. Patient dyspnic WOuld give additional 60 Cardiazem today and switch to 240 qd  Add 25 mg lopressor tid  Follow BP If HR or sympotms of SOB do not improve will need to consider DCCV earlier   Keep NPO in AM just in case    2. Anticoagulation: INR therapeutic at 2.24 (Goal is 2-3). Recommendation is to continue Coumadin  3. Abdominal pain: 2/2 perforated small bowel diverticulitis. POD #9 distal small bowel resection. Pain resolved.   4. Cardiomyopathy: LVEF 45-50% by recent echocardiogram. Still with significant peripheral edema. IV Lasix resumed yesterday. ? If I/O are accurate. Strict I/Os going forward. Renal function stable.  I would continue     LOS: 9 days    Brittainy M. Sharol HarnessSimmons, PA-C 05/31/2014 10:42 AM   Patinet seen and examined  I have amended note to reflect my findings.    Dietrich PatesPaula Kohen Reither

## 2014-05-31 NOTE — Progress Notes (Signed)
PT is now recommending SNF for rehab.  I spoke to the patient about this.  He states his wife does not want him to go to a nursing home.  I expressed my concern about his safety at home and if his wife would be able to take care of him.  I tried to contact his wife and daughter by phone and was unable to reach either of them.  I will go ahead and have social work see the patient to get things started in case they are agreeable to this plan.  NPO p MN incase he needs a TEE tomorrow.  Kinlee Garrison E 3:49 PM 05/31/2014

## 2014-05-31 NOTE — Progress Notes (Signed)
Patient ID: Lorrin GoodellCharlie W Mapel, male   DOB: 07/20/1931, 80 y.o.   MRN: [id number] 9 Days Post-Op  Subjective: Pt doesn't feel great.  He is weeping all over from his arms due to anasarca.  He is eating well though.  Passing flatus  Objective: Vital signs in last 24 hours: Temp:  [98 F (36.7 C)-98.9 F (37.2 C)] 98.9 F (37.2 C) (11/30 0555) Pulse Rate:  [59-102] 94 (11/30 0555) Resp:  [16-18] 16 (11/30 0555) BP: (122-156)/(74-80) 156/80 mmHg (11/30 0555) SpO2:  [94 %-95 %] 95 % (11/30 0555) Weight:  [222 lb 7.1 oz (100.9 kg)] 222 lb 7.1 oz (100.9 kg) (11/30 0500) Last BM Date: 05/28/14  Intake/Output from previous day: 11/29 0701 - 11/30 0700 In: 730 [P.O.:720; I.V.:10] Out: 900 [Urine:900] Intake/Output this shift: Total I/O In: -  Out: 100 [Urine:100]  PE: Abd: soft, minimally tender, wound is clean and packed.  +BS, ND, some erythema in bilateral inguinal creases with significant scrotal edema Ext: significant upper extremity edema with weeping  Lab Results:   Recent Labs  05/29/14 0243 05/30/14 0606  WBC 16.0* 18.2*  HGB 12.6* 12.1*  HCT 38.9* 36.5*  PLT 479* 208   BMET  Recent Labs  05/30/14 0606 05/31/14 0540  NA 139 141  K 3.2* 3.8  CL 102 103  CO2 23 25  GLUCOSE 179* 213*  BUN 17 18  CREATININE 1.04 0.96  CALCIUM 7.5* 7.7*   PT/INR  Recent Labs  05/30/14 0606 05/31/14 0540  LABPROT 20.7* 24.9*  INR 1.76* 2.24*   CMP     Component Value Date/Time   NA 141 05/31/2014 0540   K 3.8 05/31/2014 0540   CL 103 05/31/2014 0540   CO2 25 05/31/2014 0540   GLUCOSE 213* 05/31/2014 0540   BUN 18 05/31/2014 0540   CREATININE 0.96 05/31/2014 0540   CALCIUM 7.7* 05/31/2014 0540   PROT 6.9 05/22/2014 0047   ALBUMIN 3.3* 05/22/2014 0047   AST 23 05/22/2014 0047   ALT 22 05/22/2014 0047   ALKPHOS 72 05/22/2014 0047   BILITOT 1.6* 05/22/2014 0047   GFRNONAA 76* 05/31/2014 0540   GFRAA 88* 05/31/2014 0540   Lipase     Component Value Date/Time    LIPASE 52 05/22/2014 0047       Studies/Results: No results found.  Anti-infectives: Anti-infectives    Start     Dose/Rate Route Frequency Ordered Stop   05/22/14 0900  ciprofloxacin (CIPRO) IVPB 400 mg     400 mg200 mL/hr over 60 Minutes Intravenous Every 12 hours 05/22/14 0821 05/28/14 2247   05/22/14 0900  metroNIDAZOLE (FLAGYL) IVPB 500 mg     500 mg100 mL/hr over 60 Minutes Intravenous Every 8 hours 05/22/14 0821 05/29/14 0859   05/22/14 0300  ciprofloxacin (CIPRO) IVPB 400 mg     400 mg200 mL/hr over 60 Minutes Intravenous  Once 05/22/14 0253 05/22/14 0434   05/22/14 0300  metroNIDAZOLE (FLAGYL) IVPB 500 mg     500 mg100 mL/hr over 60 Minutes Intravenous  Once 05/22/14 0253 05/22/14 0434       Assessment/Plan   Diverticulitis of small bowel with perforation POD #9 s/p Ex lap, SBR AFIB  Leukocytosis  Plan: 1. Therapeutic on coumadin today for a fib 2. Increase lasix to 40mg  BID as he is 11L + since admission. 3. Nystatin to inguinal creases 4. Follow WBC as it is trending up.  Could be due to steroids, but may be surgical as well.  LOS: 9  days    Lindberg Zenon E 05/31/2014, 10:01 AM Pager: [phone]

## 2014-05-31 NOTE — Progress Notes (Signed)
ANTICOAGULATION CONSULT NOTE - Follow-Up Consult  Pharmacy Consult for Coumadin Indication: atrial fibrillation  No Known Allergies  Patient Measurements: Height: 5\' 11"  (180.3 cm) Weight: 222 lb 7.1 oz (100.9 kg) IBW/kg (Calculated) : 75.3   Vital Signs: Temp: 98.9 F (37.2 C) (11/30 0555) Temp Source: Oral (11/29 2221) BP: 156/80 mmHg (11/30 0555) Pulse Rate: 94 (11/30 0555)  Labs:  Recent Labs  05/29/14 0243 05/30/14 0606 05/31/14 0540  HGB 12.6* 12.1*  --   HCT 38.9* 36.5*  --   PLT 479* 208  --   LABPROT 17.5* 20.7* 24.9*  INR 1.42 1.76* 2.24*  CREATININE 0.93 1.04 0.96    Estimated Creatinine Clearance: 74.2 mL/min (by C-G formula based on Cr of 0.96).   Medical History: Past Medical History  Diagnosis Date  . Hypertension   . Myasthenia gravis   . Cellulitis   . Diverticula of small intestine     Assessment: 80yo male with diverticulitis of small intestine with perforation and bleeding s/p exploratory laparotomy and distal small bowel resection on 11/21. Found to have irregular rate and rhythm on 11/24. Cardiology [facility]([place]) noted pt to have paroxysmal Afib, rate controlled with diltiazem, reluctant to start heparin or NOAC d/t recent surgery, started on warfarin 11/26. INR now therapeutic at 2.24   On heparin SubQ No bleeding noted  Cipro 11/21 >> 11/27 Flagyl 11/21 >>11/27  Goal of Therapy:  INR 2-3 Monitor platelets by anticoagulation protocol: Yes  Plan: - Repeat Coumadin 3 mg  - Stop SQ heparin since INR > 2  - Daily INR - F/U CBC, PLT, s/sx of bleeding  Vinnie LevelBenjamin Chardae Mulkern, PharmD., BCPS Clinical Pharmacist Pager [phone]

## 2014-06-01 ENCOUNTER — Inpatient Hospital Stay (HOSPITAL_COMMUNITY): Payer: Medicare Other

## 2014-06-01 ENCOUNTER — Encounter (HOSPITAL_COMMUNITY): Payer: Self-pay | Admitting: Radiology

## 2014-06-01 DIAGNOSIS — D72829 Elevated white blood cell count, unspecified: Secondary | ICD-10-CM

## 2014-06-01 LAB — BASIC METABOLIC PANEL
ANION GAP: 13 (ref 5–15)
BUN: 18 mg/dL (ref 6–23)
CO2: 23 mEq/L (ref 19–32)
CREATININE: 1.03 mg/dL (ref 0.50–1.35)
Calcium: 7.6 mg/dL — ABNORMAL LOW (ref 8.4–10.5)
Chloride: 100 mEq/L (ref 96–112)
GFR calc non Af Amer: 66 mL/min — ABNORMAL LOW (ref >90)
GFR, EST AFRICAN AMERICAN: 77 mL/min — AB (ref >90)
Glucose, Bld: 204 mg/dL — ABNORMAL HIGH (ref 70–99)
Potassium: 3.4 mEq/L — ABNORMAL LOW (ref 3.7–5.3)
Sodium: 136 mEq/L — ABNORMAL LOW (ref 137–147)

## 2014-06-01 LAB — CBC
HCT: 37.4 % — ABNORMAL LOW (ref 39.0–52.0)
Hemoglobin: 12.5 g/dL — ABNORMAL LOW (ref 13.0–17.0)
MCH: 30.8 pg (ref 26.0–34.0)
MCHC: 33.4 g/dL (ref 30.0–36.0)
MCV: 92.1 fL (ref 78.0–100.0)
Platelets: 136 10*3/uL — ABNORMAL LOW (ref 150–400)
RBC: 4.06 MIL/uL — ABNORMAL LOW (ref 4.22–5.81)
RDW: 15.1 % (ref 11.5–15.5)
WBC: 22.3 10*3/uL — ABNORMAL HIGH (ref 4.0–10.5)

## 2014-06-01 LAB — URINALYSIS, ROUTINE W REFLEX MICROSCOPIC
Bilirubin Urine: NEGATIVE
Glucose, UA: 250 mg/dL — AB
Hgb urine dipstick: NEGATIVE
KETONES UR: NEGATIVE mg/dL
Leukocytes, UA: NEGATIVE
NITRITE: NEGATIVE
PH: 6 (ref 5.0–8.0)
Protein, ur: NEGATIVE mg/dL
Specific Gravity, Urine: 1.008 (ref 1.005–1.030)
Urobilinogen, UA: 0.2 mg/dL (ref 0.0–1.0)

## 2014-06-01 LAB — GLUCOSE, CAPILLARY
GLUCOSE-CAPILLARY: 216 mg/dL — AB (ref 70–99)
GLUCOSE-CAPILLARY: 226 mg/dL — AB (ref 70–99)
GLUCOSE-CAPILLARY: 196 mg/dL — AB (ref 70–99)

## 2014-06-01 LAB — PROTIME-INR
INR: 2.74 — AB (ref 0.00–1.49)
Prothrombin Time: 29.3 seconds — ABNORMAL HIGH (ref 11.6–15.2)

## 2014-06-01 LAB — HEMOGLOBIN A1C
Hgb A1c MFr Bld: 8.2 % — ABNORMAL HIGH (ref <5.7)
Mean Plasma Glucose: 189 mg/dL — ABNORMAL HIGH (ref <117)

## 2014-06-01 MED ORDER — WARFARIN SODIUM 5 MG PO TABS
5.0000 mg | ORAL_TABLET | Freq: Once | ORAL | Status: DC
  Filled 2014-06-01: qty 1

## 2014-06-01 MED ORDER — INSULIN ASPART 100 UNIT/ML ~~LOC~~ SOLN
0.0000 [IU] | Freq: Three times a day (TID) | SUBCUTANEOUS | Status: DC
  Administered 2014-06-01: 5 [IU] via SUBCUTANEOUS
  Administered 2014-06-02: 3 [IU] via SUBCUTANEOUS
  Administered 2014-06-02 (×2): 5 [IU] via SUBCUTANEOUS
  Administered 2014-06-03 (×2): 3 [IU] via SUBCUTANEOUS

## 2014-06-01 MED ORDER — INSULIN ASPART 100 UNIT/ML ~~LOC~~ SOLN
0.0000 [IU] | Freq: Every day | SUBCUTANEOUS | Status: DC
  Administered 2014-06-02: 2 [IU] via SUBCUTANEOUS

## 2014-06-01 MED ORDER — IOHEXOL 300 MG/ML  SOLN
100.0000 mL | Freq: Once | INTRAMUSCULAR | Status: AC | PRN
  Administered 2014-06-01: 100 mL via INTRAVENOUS

## 2014-06-01 MED ORDER — FUROSEMIDE 10 MG/ML IJ SOLN: 40.0000 mg | Freq: Every day | INTRAMUSCULAR | Status: DC

## 2014-06-01 MED ORDER — WARFARIN SODIUM 2 MG PO TABS
2.0000 mg | ORAL_TABLET | Freq: Once | ORAL | Status: AC
  Administered 2014-06-01: 2 mg via ORAL
  Filled 2014-06-01: qty 1

## 2014-06-01 MED ORDER — METOPROLOL TARTRATE 50 MG PO TABS
50.0000 mg | ORAL_TABLET | Freq: Three times a day (TID) | ORAL | Status: DC
  Administered 2014-06-01 – 2014-06-02 (×4): 50 mg via ORAL
  Filled 2014-06-01 (×4): qty 1

## 2014-06-01 MED ORDER — POTASSIUM CHLORIDE CRYS ER 20 MEQ PO TBCR
20.0000 meq | EXTENDED_RELEASE_TABLET | Freq: Three times a day (TID) | ORAL | Status: DC
  Administered 2014-06-01 – 2014-06-03 (×6): 20 meq via ORAL
  Filled 2014-06-01 (×9): qty 1

## 2014-06-01 MED ORDER — FUROSEMIDE 10 MG/ML IJ SOLN
60.0000 mg | Freq: Three times a day (TID) | INTRAMUSCULAR | Status: DC
  Administered 2014-06-01 – 2014-06-02 (×4): 60 mg via INTRAVENOUS
  Filled 2014-06-01 (×4): qty 6

## 2014-06-01 MED ORDER — IOHEXOL 300 MG/ML  SOLN
25.0000 mL | INTRAMUSCULAR | Status: AC
  Administered 2014-06-01 (×2): 25 mL via ORAL

## 2014-06-01 MED ORDER — DILTIAZEM HCL ER COATED BEADS 180 MG PO CP24
180.0000 mg | ORAL_CAPSULE | Freq: Every day | ORAL | Status: DC
  Administered 2014-06-02 – 2014-06-03 (×2): 180 mg via ORAL
  Filled 2014-06-01 (×2): qty 1

## 2014-06-01 NOTE — Progress Notes (Signed)
Inpatient Diabetes Program Recommendations  AACE/ADA: New Consensus Statement on Inpatient Glycemic Control (2013)  Target Ranges:  Prepandial:   less than 140 mg/dL      Peak postprandial:   less than 180 mg/dL (1-2 hours)      Critically ill patients:  140 - 180 mg/dL  Results for [name], Castiel W (MRN [id number]) as of 06/01/2014 10:46  Ref. Range 05/22/2014 07:33 05/22/2014 11:27 05/22/2014 15:06  Glucose-Capillary Latest Range: 70-99 mg/dL 409151 (H) 811256 (H) 914245 (H)   Inpatient Diabetes Program Recommendations Correction (SSI): add Novolog sensitive scale TID during steroid therapy HgbA1C: order to assess prehospital glucose control  Thank you  Piedad ClimesGina Nivea Wojdyla BSN, RN,CDE Inpatient Diabetes Coordinator [phone] (team pager)

## 2014-06-01 NOTE — Care Management Note (Signed)
  Page 1 of 1   06/01/2014     10:04:28 AM CARE MANAGEMENT NOTE 06/01/2014  Patient:  [name],[name]   Account Number:  [id number]  Date Initiated:  05/26/2014  Documentation initiated by:  Avie ArenasBROWN,SARAH  Subjective/Objective Assessment:   Admitted with abd pain - post op remained intubated in ICU. Extubated - now with Afib with RVR on cardizem drip     Action/Plan:   Anticipated DC Date:  06/03/2014   Anticipated DC Plan:  [name]      DC Planning Services  CM consult      Choice offered to / List presented to:             Status of service:  In process, will continue to follow Medicare Important Message given?  YES (If response is "NO", the following Medicare IM given date fields will be blank) Date Medicare IM given:  05/26/2014 Medicare IM given by:  [facility]ARAH Date Additional Medicare IM given:  06/01/2014 Additional Medicare IM given by:  Ronny FlurryHEATHER Arasely Akkerman  Discharge Disposition:    Per UR Regulation:  Reviewed for med. necessity/level of care/duration of stay  If discussed at Long Length of Stay Meetings, dates discussed:   06/01/2014    Comments:  Contact:  [place],Ida Spouse [phone]

## 2014-06-01 NOTE — Progress Notes (Signed)
NUTRITION FOLLOW UP  Intervention:    Diet advancement as able per MD, recommend regular diet with Ensure Complete po BID, each supplement provides 350 kcal and 13 grams of protein.  Nutrition Dx:   Inadequate oral intake related to inability to eat as evidenced by NPO status, ongoing.  Goal:   Intake to meet >90% of estimated nutrition needs, unmet.  Monitor:   Diet advancement, PO intake, labs, weight trend.  Assessment:   80 yo male with hx HTN, myasthenia gravis, diverticulitis presented 11/21 with progressive abd pain and constipation. CT findings c/w perforated diverticulitis and clinical picture concerning for worsening peritonitis. He was taken to OR urgently for exp lap and small bowel resection. Returned to ICU on vent post op and PCCM called to assist.   11/24: Patient was extubated on 11/22. Surgery team following; awaiting return of bowel function to start advancing diet. Remains NPO at this time. NGT in place for suction, may be able to clamp it tomorrow.   12/1: Pt NPO for CT abd/pelvis. Spoke with pt who said that he was eating yesterday. Meal completion recorded as 75%. He says that he feels hungry.  Per MD note, pt is eating well and passing flatus. Recommend nutritional supplements once diet is advanced.   Height: Ht Readings from Last 1 Encounters:  06/01/14 6' (1.829 m)    Weight Status:   Wt Readings from Last 1 Encounters:  06/01/14 213 lb 10 oz (96.9 kg)    Re-estimated needs:  Kcal: 1900-2100 Protein: 100-110 gm Fluid: >/= 2 L  Skin: abdominal incision  Diet Order: Diet NPO time specified   Intake/Output Summary (Last 24 hours) at 06/01/14 1537 Last data filed at 06/01/14 1321  Gross per 24 hour  Intake      0 ml  Output   1725 ml  Net  -1725 ml    Last BM: 11/27   Labs:   Recent Labs Lab 05/30/14 0606 05/31/14 0540 06/01/14 0530  NA 139 141 136*  K 3.2* 3.8 3.4*  CL 102 103 100  CO2 23 25 23   BUN 17 18 18   CREATININE  1.04 0.96 1.03  CALCIUM 7.5* 7.7* 7.6*  GLUCOSE 179* 213* 204*    CBG (last 3)   Recent Labs  06/01/14 1317  GLUCAP 216*    Scheduled Meds: . antiseptic oral rinse  7 mL Mouth Rinse QID  . chlorhexidine  15 mL Mouth Rinse BID  . [START ON 06/02/2014] diltiazem  180 mg Oral Daily  . furosemide  60 mg Intravenous TID  . insulin aspart  0-15 Units Subcutaneous TID WC  . insulin aspart  0-5 Units Subcutaneous QHS  . metoprolol tartrate  50 mg Oral TID  . nystatin   Topical BID  . potassium chloride  20 mEq Oral TID  . predniSONE  10 mg Oral Q breakfast  . warfarin  2 mg Oral ONCE-1800  . warfarin   Does not apply Once  . Warfarin - Pharmacist Dosing Inpatient   Does not apply q1800    Continuous Infusions:    Emmaline KluverHaley Bunnie Rehberg MS, RD, LDN

## 2014-06-01 NOTE — Clinical Social Work Psychosocial (Signed)
Clinical Social Work Department BRIEF PSYCHOSOCIAL ASSESSMENT 06/01/2014  Patient:  [name],[name]     Account Number:  [id number]     Admit date:  05/22/2014  Clinical Social Worker:  Merlyn LotHOLOMAN,Annaleise Burger, CLINICAL SOCIAL WORKER  Date/Time:  06/01/2014 11:30 AM  Referred by:  Physician  Date Referred:  05/31/2014 Referred for  SNF Placement   Other Referral:   Interview type:  Patient Other interview type:   Also spoke with patients wife and daughter    PSYCHOSOCIAL DATA Living Status:  WIFE Admitted from facility:   Level of care:   Primary support name:  Obie Dredgeda Moore Primary support relationship to patient:  SPOUSE Degree of support available:   Patients wife and daughter, Angelica ChessmanMandy, appear to be strong support systerm for the patient.    CURRENT CONCERNS Current Concerns  Post-Acute Placement   Other Concerns:   none    SOCIAL WORK ASSESSMENT / PLAN CSW spoke with patient and patients wife and daughter at bedside concerning PT recommendation for SNF.  Patient is agreeable to short term SNF placement and to CSW [place].  CSW will continue to follow   Assessment/plan status:  Psychosocial Support/Ongoing Assessment of Needs Other assessment/ plan:   FL2  PASAR   Information/referral to community resources[place]    PATIENT'S/FAMILY'S RESPONSE TO PLAN OF CARE: Patients wife was not agreeable to SNF but patient stated that she is incapable of taking care of him at this time. Patients daughter is agreeable to looking into options for SNF.       Merlyn LotJenna Holoman, LCSWA Clinical Social Worker [phone]

## 2014-06-01 NOTE — Consult Note (Signed)
[name]. [facility] Medical Consultation  [name] DOB: 01/16/1932 DOA: 05/22/2014 PCP: [name]   Requesting physician: surgery Date of consultation: 06/01/14 Reason for consultation: leucocytosis  Impression/Recommendations Active Problems:   HTN (hypertension)   PAF (paroxysmal atrial fibrillation)   S/P small bowel resection Nov 2015    Leukocytosis- cbc with diff in AM, on steroids chronically and baseline WBC count 14-15.  U/A pending, chest x-ray with no PNA, small pleural effusions, no diarrhea and recent c diff negative, CT abd/pelvis pending  Myasthenia gravis- continue prednisone  Acute resp failure- may need chest CT scan, check ABG, incentive spirometry, wean O2 off  Atrial fib- per cardiology  S/p SB resection- per primary  TR[facility] will followup again tomorrow. Please contact me if I can be of assistance in the meanwhile. Thank you for this consultation.  Chief Complaint:   HPI:  Mr. [name] is a 80 year old male admitted on 1121 with complaints of abdominal pain. CT scan findings showed perforated diverticulitis and a worsening peritonitis. Patient was intubated and cared for by PCCM in the beginning but was quickly extubated. Patient is now postop day #10 for exploratory laparotomy and small bowel resection. On 1124 patient developed new onset atrial fibrillation with RVR. Patient was started on Cardizem as well as warfarin. Patient has a history of myasthenia gravis and is on chronic prednisone at home. 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 Hospitalists were consulted as patient has developed an increasing white blood cell count but no fever. Patient states he is just not feeling well   Review of Systems:  Unable to do ROS as patient falls asleep- just states he is feeling poorly   Past Medical History  Diagnosis Date  . Hypertension    . Myasthenia gravis   . Cellulitis   . Diverticula of small intestine    Past Surgical History  Procedure Laterality Date  . Eye surgery    . Abdominal surgery    . Cholecystectomy    . Small intestine surgery  2006    perf SB diverticulitis  . Laparotomy N/A 05/22/2014    Procedure: EXPLORATORY LAPAROTOMY;  Surgeon: Erik InaEric M Wilson, [name];  [facility];  Service: General;  Laterality: N/A;  . Bowel resection N/A 05/22/2014    Procedure: SMALL BOWEL RESECTION;  Surgeon: Erik InaEric M Wilson, [name];  [facility];  Service: General;  Laterality: N/A;   Social History:  reports that he has quit smoking. He has never used smokeless tobacco. He reports that he does not drink alcohol or use illicit drugs.  No Known Allergies Family History  Problem Relation Age of Onset  . Heart failure Father     Prior to Admission medications   Medication Sig Start Date End Date Taking? Authorizing Provider  alendronate (FOSAMAX) 70 MG tablet Take 70 mg by mouth every Sunday. Take with a full glass of water on an empty stomach.   Yes Historical Provider, [name]  amLODipine (NORVASC) 5 MG tablet Take 5 mg by mouth daily.   Yes Historical Provider, [name]  brimonidine-timolol (COMBIGAN) 0.2-0.5 % ophthalmic solution Place 1 drop into the left eye every 12 (twelve) hours.   Yes Historical Provider, [name]  furosemide (LASIX) 20 MG tablet Take 40 mg by mouth daily.    Yes Historical Provider, [name]  irbesartan (AVAPRO) 150 MG tablet Take 150 mg by mouth at bedtime.   Yes  Historical Provider, [name]  OVER THE COUNTER MEDICATION Take 1 tablet by mouth every morning. antacid   Yes Historical Provider, [name]  potassium phosphate, monobasic, (K-PHOS ORIGINAL) 500 MG tablet Take 500 mg by mouth daily.   Yes Historical Provider, [name]  predniSONE (DELTASONE) 10 MG tablet Take 10 mg by mouth daily.   Yes Historical Provider, [name]  sildenafil (REVATIO) 20 MG tablet Take 20-40 mg by mouth daily as needed (1 hour prior to sexual intercourse).   Yes  Historical Provider, [name]  white petrolatum (VASELINE) GEL Apply 1 application topically as needed (to where skin was removed).   Yes Historical Provider, [name]   Physical Exam: Blood pressure 135/63, pulse 98, temperature 98 F (36.7 C), temperature source Oral, resp. rate 16, height 5\' 11"  (1.803 m), weight 100.9 kg (222 lb 7.1 oz), SpO2 95 %. Filed Vitals:   06/01/14 0616  BP: 135/63  Pulse: 98  Temp: 98 F (36.7 C)  Resp: 16     General:  Ill appearing  Eyes: wnl  ENT: wnl  Neck: supple  Cardiovascular: irr  Respiratory: decreased effort, no wheezing  Abdomen: +Bs, soft, mildly tender  Skin: few areas of bruising but no  rashes  Musculoskeletal: b/l edema  Psychiatric: few words  Neurologic: no focal deficit  Labs on Admission:  Basic Metabolic Panel:  Recent Labs Lab 05/28/14 1549 05/29/14 0243 05/30/14 0606 05/31/14 0540 06/01/14 0530  NA 140 140 139 141 136*  K 3.4* 3.6* 3.2* 3.8 3.4*  CL 103 105 102 103 100  CO2 26 23 23 25 23   GLUCOSE 201* 186* 179* 213* 204*  BUN 15 14 17 18 18   CREATININE 1.02 0.93 1.04 0.96 1.03  CALCIUM 7.2* 7.2* 7.5* 7.7* 7.6*   Liver Function Tests: No results for input(s): AST, ALT, ALKPHOS, BILITOT, PROT, ALBUMIN in the last 168 hours. No results for input(s): LIPASE, AMYLASE in the last 168 hours. No results for input(s): AMMONIA in the last 168 hours. CBC:  Recent Labs Lab 05/28/14 0915 05/29/14 0243 05/30/14 0606 05/31/14 1015 06/01/14 0530  WBC 16.6* 16.0* 18.2* 21.2* 22.3*  HGB 12.6* 12.6* 12.1* 12.5* 12.5*  HCT 38.4* 38.9* 36.5* 38.6* 37.4*  MCV 93.2 93.3 92.6 92.6 92.1  PLT 496* 479* 208 177 136*   Cardiac Enzymes: No results for input(s): CKTOTAL, CKMB, CKMBINDEX, TROPONINI in the last 168 hours. BNP: Invalid input(s): POCBNP CBG: No results for input(s): GLUCAP in the last 168 hours.  Radiological Exams on Admission: Dg Chest Port 1 View  06/01/2014   CLINICAL DATA:  Shortness of breath with  exertion, leukocytosis, prior abdominal surgery 11 days ago, fluid retention, personal history of myasthenia gravis, hypertension, former smoker  EXAM: PORTABLE CHEST - 1 VIEW  COMPARISON:  Portable exam 0954 hr compared to 05/23/2014  FINDINGS: LEFT subclavian line stable tip projecting over brachiocephalic vein at/near SVC confluence.  Enlargement of cardiac silhouette.  Mediastinal contours and pulmonary vascularity normal.  Minimal bibasilar atelectasis.  Upper lungs clear.  No pleural effusion or pneumothorax.  Scattered endplate spur formation thoracic spine.  IMPRESSION: Minimal bibasilar atelectasis without definite acute infiltrate.   Electronically Signed   By: Ulyses SouthwardMark  Boles M.D.   On: 06/01/2014 10:07      Time spent: 7265  [facility], Remus Hagedorn [facility] Pager [phone]  If 7PM-7AM, please contact night-coverage [email or web address] Password [email or web address] 06/01/2014, 10:29 AM

## 2014-06-01 NOTE — Clinical Social Work Placement (Addendum)
Clinical Social Work Department CLINICAL SOCIAL WORK PLACEMENT NOTE 06/01/2014  Patient:  [name],[name]  Account Number:  [id number] Admit date:  05/22/2014  Clinical Social Worker:  Merlyn LotJENNA HOLOMAN, CLINICAL SOCIAL WORKER  Date/time:  06/01/2014 12:00 N  Clinical Social Work is seeking post-discharge placement for this patient at the following level of care:   SKILLED NURSING   (*CSW will update this form in Epic as items are completed)   06/01/2014  Patient/family provided with [facility] Department of Clinical Social Work's list of facilities offering this level of care within the geographic area requested by the patient (or if unable, by the patient's family).  06/01/2014  Patient/family informed of their freedom to choose among providers that offer the needed level of care, that participate in Medicare, Medicaid or managed care program needed by the patient, have an available bed and are willing to accept the patient.  06/01/2014  Patient/family informed of [facility]' ownership interest in [facility], as well as of the fact that they are under no obligation to receive care at this facility.  PASARR submitted to EDS on 06/01/2014 PASARR number received on 06/01/2014  FL2 transmitted to all facilities in geographic area requested by pt/family on  06/01/2014 FL2 transmitted to all facilities within larger geographic area on   Patient informed that his/her managed care company has contracts with or will negotiate with  certain facilities, including the following:     Patient/family informed of bed offers received:  06/03/2014 [name]([name], [name]) Patient [facility] bed at  [facility] [name]([name], [name]) Physician recommends and patient chooses bed at    [facility] on  06/03/2014 [name]([name], [facility]) Patient to be transferred to facility by [facility] [name]([name], [name]) Patient and family notified of transfer on  06/03/2014 [name]([name], [name]) Name of family member notified:  Pt, pt's daughter/son-in-law, and pt's wife updated. [name]([name], [name])  The following physician request were entered in Epic:   Additional Comments:  Erik Kochermily Taichi Repka, [name] [phone]) Licensed Clinical Social Worker Orthopedics [phone]([id number]) and Surgical [phone]([id number])

## 2014-06-01 NOTE — Progress Notes (Signed)
Subjective: [name]  Breathing OK   Objective: Filed Vitals:   05/31/14 0555 05/31/14 1417 05/31/14 2208 06/01/14 0616  BP: 156/80 139/88 142/83 135/63  Pulse: 94 82 88 98  Temp: 98.9 F (37.2 C) 97.8 F (36.6 C) 97 F (36.1 C) 98 F (36.7 C)  TempSrc:  Oral Axillary Oral  Resp: 16 19 17 16   Height:      Weight:      SpO2: 95% 97% 95% 95%   Weight change:   Intake/Output Summary (Last 24 hours) at 06/01/14 1311 Last data filed at 06/01/14 1135  Gross per 24 hour  Intake      0 ml  Output   1525 ml  Net  -1525 ml    General: Alert, awake, oriented x3, in no acute distress Neck:  JVP is increased  Heart: Irregular rate and rhythm, without murmurs, rubs, gallops.  Lungs: Clear to auscultation.  No rales or wheezes. Exemities:  2+ edema.   Neuro: Grossly intact, nonfocal.  Tele:  Afib 90s    Lab Results: Results for orders placed or performed during the hospital encounter of 05/22/14 (from the past 24 hour(s))  Basic metabolic panel     Status: Abnormal   Collection Time: 06/01/14  5:30 AM  Result Value Ref Range   Sodium 136 (L) 137 - 147 mEq/L   Potassium 3.4 (L) 3.7 - 5.3 mEq/L   Chloride 100 96 - 112 mEq/L   CO2 23 19 - 32 mEq/L   Glucose, Bld 204 (H) 70 - 99 mg/dL   BUN 18 6 - 23 mg/dL   Creatinine, Ser 0.981.03 0.50 - 1.35 mg/dL   Calcium 7.6 (L) 8.4 - 10.5 mg/dL   GFR calc non Af Amer 66 (L) >90 mL/min   GFR calc Af Amer 77 (L) >90 mL/min   Anion gap 13 5 - 15  CBC     Status: Abnormal   Collection Time: 06/01/14  5:30 AM  Result Value Ref Range   WBC 22.3 (H) 4.0 - 10.5 K/uL   RBC 4.06 (L) 4.22 - 5.81 MIL/uL   Hemoglobin 12.5 (L) 13.0 - 17.0 g/dL   HCT 11.937.4 (L) 14.739.0 - 82.952.0 %   MCV 92.1 78.0 - 100.0 fL   MCH 30.8 26.0 - 34.0 pg   MCHC 33.4 30.0 - 36.0 g/dL   RDW 56.215.1 13.011.5 - 86.515.5 %   Platelets 136 (L) 150 - 400 K/uL  Protime-INR     Status: Abnormal   Collection Time: 06/01/14  5:30 AM  Result Value Ref Range   Prothrombin Time 29.3 (H) 11.6 -  15.2 seconds   INR 2.74 (H) 0.00 - 1.49  Urinalysis, Routine w reflex microscopic     Status: Abnormal   Collection Time: 06/01/14 10:24 AM  Result Value Ref Range   Color, Urine YELLOW YELLOW   APPearance CLEAR CLEAR   Specific Gravity, Urine 1.008 1.005 - 1.030   pH 6.0 5.0 - 8.0   Glucose, UA 250 (A) NEGATIVE mg/dL   Hgb urine dipstick NEGATIVE NEGATIVE   Bilirubin Urine NEGATIVE NEGATIVE   Ketones, ur NEGATIVE NEGATIVE mg/dL   Protein, ur NEGATIVE NEGATIVE mg/dL   Urobilinogen, UA 0.2 0.0 - 1.0 mg/dL   Nitrite NEGATIVE NEGATIVE   Leukocytes, UA NEGATIVE NEGATIVE    Studies/Results: No results found.  Medications: REviewed   @PROBHOSP @  1  Afib  Rates better  I will increase lopressor and in AM would taper dilt to 180  Use b blocker more with history of LV dysfunction On coumadin    2.  Combined systolic/diastolic CHF  Will increase diuresis with tid lasix  Patinet with marked volume overload  Replete K   3  Myasthenia gravis.    4  Resp failue .  Diuresis should help.    5  Leukocytosis.  IM seeing patient to evaluate     LOS: 10 days   [name] 06/01/2014, 1:11 PM

## 2014-06-01 NOTE — Progress Notes (Signed)
ANTICOAGULATION CONSULT NOTE - Follow-Up Consult  Pharmacy Consult for Coumadin Indication: atrial fibrillation  No Known Allergies  Patient Measurements: Height: 5\' 11"  (180.3 cm) Weight: 222 lb 7.1 oz (100.9 kg) IBW/kg (Calculated) : 75.3   Vital Signs: Temp: 98 F (36.7 C) (12/01 0616) Temp Source: Oral (12/01 0616) BP: 135/63 mmHg (12/01 0616) Pulse Rate: 98 (12/01 0616)  Labs:  Recent Labs  05/30/14 0606 05/31/14 0540 05/31/14 1015 06/01/14 0530  HGB 12.1*  --  12.5* 12.5*  HCT 36.5*  --  38.6* 37.4*  PLT 208  --  177 136*  LABPROT 20.7* 24.9*  --  29.3*  INR 1.76* 2.24*  --  2.74*  CREATININE 1.04 0.96  --  1.03    Estimated Creatinine Clearance: 69.2 mL/min (by C-G formula based on Cr of 1.03).   Medical History: Past Medical History  Diagnosis Date  . Hypertension   . Myasthenia gravis   . Cellulitis   . Diverticula of small intestine     Assessment: 80yo male with diverticulitis of small intestine with perforation and bleeding s/p exploratory laparotomy and distal small bowel resection on 11/21. Found to have irregular rate and rhythm on 11/24. Cardiology [facility]) noted pt to have paroxysmal Afib, rate controlled with diltiazem, reluctant to start heparin or NOAC d/t recent surgery, started on warfarin 11/26. INR remains therapeutic at 2.74. Plt have dropped significantly since admission. Per RN, no signs of bleeding, thrombosis or skin necrosis. SubQ heparin was d/c'ed yesterday.    Goal of Therapy:  INR 2-3 Monitor platelets by anticoagulation protocol: Yes  Plan: - Coumadin 2 mg x 1 dose today  - Daily INR - F/U CBC, PLT, s/sx of bleeding  Vinnie LevelBenjamin Adean Milosevic, PharmD., BCPS Clinical Pharmacist Pager [phone]

## 2014-06-01 NOTE — Progress Notes (Signed)
Patient ID: [name], male   DOB: 06/03/1932, 80 y.o.   MRN: [id number] 10 Days Post-Op  Subjective: Pt doesn't know how he feels this morning.  Arms are still weeping.  Didn't void as much yesterday even with increase in lasix.  Eating well.  Denies cough, denies need for O2 at home, denies abdominal pain.  Objective: Vital signs in last 24 hours: Temp:  [97 F (36.1 C)-98 F (36.7 C)] 98 F (36.7 C) (12/01 0616) Pulse Rate:  [82-98] 98 (12/01 0616) Resp:  [16-19] 16 (12/01 0616) BP: (135-142)/(63-88) 135/63 mmHg (12/01 0616) SpO2:  [95 %-97 %] 95 % (12/01 0616) Last BM Date: 05/28/14  Intake/Output from previous day: 11/30 0701 - 12/01 0700 In: -  Out: 600 [Urine:600] Intake/Output this shift:    PE: Abd: soft, minimally tender, incisional wound is clean and packed, +BS, ND Heart: irregular Lungs: CTAB, but on 3L [place]  Lab Results:   Recent Labs  05/31/14 1015 06/01/14 0530  WBC 21.2* 22.3*  HGB 12.5* 12.5*  HCT 38.6* 37.4*  PLT 177 136*   BMET  Recent Labs  05/31/14 0540 06/01/14 0530  NA 141 136*  K 3.8 3.4*  CL 103 100  CO2 25 23  GLUCOSE 213* 204*  BUN 18 18  CREATININE 0.96 1.03  CALCIUM 7.7* 7.6*   PT/INR  Recent Labs  05/31/14 0540 06/01/14 0530  LABPROT 24.9* 29.3*  INR 2.24* 2.74*   CMP     Component Value Date/Time   NA 136* 06/01/2014 0530   K 3.4* 06/01/2014 0530   CL 100 06/01/2014 0530   CO2 23 06/01/2014 0530   GLUCOSE 204* 06/01/2014 0530   BUN 18 06/01/2014 0530   CREATININE 1.03 06/01/2014 0530   CALCIUM 7.6* 06/01/2014 0530   PROT 6.9 05/22/2014 0047   ALBUMIN 3.3* 05/22/2014 0047   AST 23 05/22/2014 0047   ALT 22 05/22/2014 0047   ALKPHOS 72 05/22/2014 0047   BILITOT 1.6* 05/22/2014 0047   GFRNONAA 66* 06/01/2014 0530   GFRAA 77* 06/01/2014 0530   Lipase     Component Value Date/Time   LIPASE 52 05/22/2014 0047       Studies/Results: No results found.  Anti-infectives: Anti-infectives    Start      Dose/Rate Route Frequency Ordered Stop   05/22/14 0900  ciprofloxacin (CIPRO) IVPB 400 mg     400 mg200 mL/hr over 60 Minutes Intravenous Every 12 hours 05/22/14 0821 05/28/14 2247   05/22/14 0900  metroNIDAZOLE (FLAGYL) IVPB 500 mg     500 mg100 mL/hr over 60 Minutes Intravenous Every 8 hours 05/22/14 0821 05/29/14 0859   05/22/14 0300  ciprofloxacin (CIPRO) IVPB 400 mg     400 mg200 mL/hr over 60 Minutes Intravenous  Once 05/22/14 0253 05/22/14 0434   05/22/14 0300  metroNIDAZOLE (FLAGYL) IVPB 500 mg     500 mg100 mL/hr over 60 Minutes Intravenous  Once 05/22/14 0253 05/22/14 0434       Assessment/Plan 1. POD 10, s/p ex lap with SBR for diverticulitis of small bowel 2. ARF, improving, but on O2 3. AKI, resolved 4. Myasthenia gravis 5. Leukocytosis, up to 22K   6. A. Fib, on coumadin  Plan: 1. Currently NPO for possible TEE per cards today 2. Patient continues to have anasarca with weeping.  On his home dose of lasix.  Increased to BID yesterday, but UOP actually went down.  Cr up to 1.03.  Lasix dropped back to daily. 3. On  prednisone for myasthenia gravis 4. Still requiring 3L [place] at rest to keep sats up.   5. Will check UA, PCXR, and CT A/P today to follow up WBC despite no symptoms and no fever.  Some may be due to steroids, but don't expect a continued increase.  We will also have medicine evaluate the patient help us with his management.  LOS: 10 days    Budd Freiermuth E 06/01/2014, 8:55 AM Pager: [phone]

## 2014-06-02 DIAGNOSIS — I5021 Acute systolic (congestive) heart failure: Secondary | ICD-10-CM

## 2014-06-02 LAB — GLUCOSE, CAPILLARY
GLUCOSE-CAPILLARY: 214 mg/dL — AB (ref 70–99)
Glucose-Capillary: 203 mg/dL — ABNORMAL HIGH (ref 70–99)
Glucose-Capillary: 179 mg/dL — ABNORMAL HIGH (ref 70–99)
Glucose-Capillary: 208 mg/dL — ABNORMAL HIGH (ref 70–99)

## 2014-06-02 LAB — BASIC METABOLIC PANEL
Anion gap: 15 (ref 5–15)
BUN: 18 mg/dL (ref 6–23)
CALCIUM: 7.5 mg/dL — AB (ref 8.4–10.5)
CHLORIDE: 100 meq/L (ref 96–112)
CO2: 25 meq/L (ref 19–32)
CREATININE: 1.18 mg/dL (ref 0.50–1.35)
GFR calc Af Amer: 65 mL/min — ABNORMAL LOW (ref >90)
GFR calc non Af Amer: 56 mL/min — ABNORMAL LOW (ref >90)
GLUCOSE: 166 mg/dL — AB (ref 70–99)
Potassium: 3.8 mEq/L (ref 3.7–5.3)
Sodium: 140 mEq/L (ref 137–147)

## 2014-06-02 LAB — CBC WITH DIFFERENTIAL/PLATELET
Basophils Absolute: 0 10*3/uL (ref 0.0–0.1)
Basophils Relative: 0 % (ref 0–1)
Eosinophils Absolute: 0.1 10*3/uL (ref 0.0–0.7)
Eosinophils Relative: 0 % (ref 0–5)
HCT: 39.4 % (ref 39.0–52.0)
Hemoglobin: 13 g/dL (ref 13.0–17.0)
LYMPHS ABS: 3.1 10*3/uL (ref 0.7–4.0)
LYMPHS PCT: 14 % (ref 12–46)
MCH: 31.6 pg (ref 26.0–34.0)
MCHC: 33 g/dL (ref 30.0–36.0)
MCV: 95.6 fL (ref 78.0–100.0)
Monocytes Absolute: 0.9 10*3/uL (ref 0.1–1.0)
Monocytes Relative: 4 % (ref 3–12)
NEUTROS ABS: 18.3 10*3/uL — AB (ref 1.7–7.7)
NEUTROS PCT: 82 % — AB (ref 43–77)
PLATELETS: 108 10*3/uL — AB (ref 150–400)
RBC: 4.12 MIL/uL — AB (ref 4.22–5.81)
RDW: 15.2 % (ref 11.5–15.5)
WBC: 22.3 10*3/uL — AB (ref 4.0–10.5)

## 2014-06-02 LAB — PROTIME-INR
INR: 2.71 — AB (ref 0.00–1.49)
PROTHROMBIN TIME: 29 s — AB (ref 11.6–15.2)

## 2014-06-02 MED ORDER — WARFARIN SODIUM 2.5 MG PO TABS
2.5000 mg | ORAL_TABLET | Freq: Once | ORAL | Status: AC
  Administered 2014-06-02: 2.5 mg via ORAL
  Filled 2014-06-02: qty 1

## 2014-06-02 MED ORDER — ALTEPLASE 2 MG IJ SOLR
2.0000 mg | Freq: Once | INTRAMUSCULAR | Status: AC
  Administered 2014-06-02: 2 mg
  Filled 2014-06-02: qty 2

## 2014-06-02 MED ORDER — METOPROLOL TARTRATE 50 MG PO TABS
50.0000 mg | ORAL_TABLET | Freq: Four times a day (QID) | ORAL | Status: DC
  Administered 2014-06-02 – 2014-06-03 (×3): 50 mg via ORAL
  Filled 2014-06-02 (×5): qty 1

## 2014-06-02 MED ORDER — FUROSEMIDE 10 MG/ML IJ SOLN
80.0000 mg | Freq: Three times a day (TID) | INTRAMUSCULAR | Status: DC
  Administered 2014-06-02: 80 mg via INTRAVENOUS
  Filled 2014-06-02 (×4): qty 8

## 2014-06-02 NOTE — Consult Note (Signed)
[facility] PROGRESS NOTE  Lorrin GoodellCharlie W Calligan UJW:[id number]:[id number] DOB: 07/23/1931 DOA: 05/22/2014 PCP: Josue HectorNYLAND,LEONARD ROBERT, MD  Assessment/Plan: 80 year old male admitted on 11/21 with complaints of abdominal pain. CT scan findings showed perforated diverticulitis and a worsening peritonitis. Patient was intubated and cared for by PCCM in the beginning but was quickly extubated. Patient is now postop day #10 for exploratory laparotomy and small bowel resection. On 1124 patient developed new onset atrial fibrillation with RVR. Patient was started on Cardizem as well as warfarin. Patient has a history of myasthenia gravis and is on chronic prednisone at home. 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 Hospitalists were consulted as patient has developed an increasing white blood cell count but no fever.  1. Leukocytosis? related to steroids+recent surgery; on steroids chronically and baseline WBC count 14-15. -U/A: unremarkable; , chest x-ray with no PNA, small pleural effusions, no diarrhea and recent c diff negative, blood cultures NGTD, urine cultures neg; CT abd/pelvis: post op changes  -remains afebrile; repeat labs in AM   2. Myasthenia gravis- continue prednisone 3. Acute resp failure post op/extubated, cont incentive spirometry, wean O2 off 4. Atrial fib- per cardiology; on BB, coumadin 5. Acute on chronic CHF, systolic HF; LVEF 40-45%; cont diuretics per cards;  6. S/p SB resection- per primary 7. 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 Please contact me if I can be of assistance in the meanwhile. Thank you for this consultation..  Code Status: full Family Communication: d/w patient (indicate person spoken with, relationship, and if by phone, the number) Disposition Plan: per primary     Consultants:  surgery  Procedures:  abd surgery   Antibiotics:  none (indicate start date, and stop date if known)  HPI/Subjective: alert  Objective: Filed Vitals:   06/02/14 0605  BP: 108/68  Pulse: 71  Temp: 98.1 F (36.7 C)  Resp: 18    Intake/Output Summary (Last 24 hours) at 06/02/14 1335 Last data filed at 06/02/14 1300  Gross per 24 hour  Intake   1080 ml  Output   2350 ml  Net  -1270 ml   Filed Weights   05/30/14 0500 05/31/14 0500 06/01/14 1431  Weight: 100.5 kg (221 lb 9 oz) 100.9 kg (222 lb 7.1 oz) 96.9 kg (213 lb 10 oz)    Exam:   General:  alert  Cardiovascular: s1,s2 rrr  Respiratory: few crackles in LL  Abdomen: soft,mild tender, distended   Musculoskeletal: mild leg edema   Data Reviewed: Basic Metabolic Panel:  Recent Labs Lab 05/29/14 0243 05/30/14 0606 05/31/14 0540 06/01/14 0530 06/02/14 0625  NA 140 139 141 136* 140  K 3.6* 3.2* 3.8 3.4* 3.8  CL 105 102 103 100 100  CO2 23 23 25 23 25   GLUCOSE 186* 179* 213* 204* 166*  BUN 14 17 18 18 18   CREATININE 0.93 1.04 0.96 1.03 1.18  CALCIUM 7.2* 7.5* 7.7* 7.6* 7.5*   Liver Function Tests: No results for input(s): AST, ALT, ALKPHOS, BILITOT, PROT, ALBUMIN in the last 168 hours. No results for input(s): LIPASE, AMYLASE in the last 168 hours. No results for input(s): AMMONIA in the last 168 hours. CBC:  Recent Labs Lab 05/29/14 0243 05/30/14 0606 05/31/14 1015 06/01/14 0530 06/02/14 0624  WBC 16.0* 18.2* 21.2* 22.3* 22.3*  NEUTROABS  --   --   --   --  18.3*  HGB 12.6* 12.1* 12.5* 12.5* 13.0  HCT 38.9* 36.5* 38.6* 37.4* 39.4  MCV 93.3 92.6 92.6 92.1 95.6  PLT 479* 208 177 136* 108*   Cardiac Enzymes: No results for input(s): CKTOTAL, CKMB, CKMBINDEX, TROPONINI in the last 168 hours. BNP (last 3 results) No results for input(s): PROBNP in the last 8760 hours. CBG:  Recent Labs Lab 06/01/14 1317 06/01/14 1813 06/01/14 2158 06/02/14 0744 06/02/14 1226   GLUCAP 216* 226* 196* 203* 179*    Recent Results (from the past 240 hour(s))  Clostridium Difficile by PCR     Status: None   Collection Time: 05/28/14  8:00 PM  Result Value Ref Range Status   C difficile by pcr NEGATIVE NEGATIVE Final     Studies: Ct Abdomen Pelvis W Contrast  06/01/2014   CLINICAL DATA:  80 year old male with hypertension, myasthenia gravis, with diverticulitis post abdominal surgery and bowel resection on 05/22/2014, now with postoperative leukocytosis  EXAM: CT ABDOMEN AND PELVIS WITH CONTRAST  TECHNIQUE: Multidetector CT imaging of the abdomen and pelvis was performed using the standard protocol following bolus administration of intravenous contrast. Sagittal and coronal MPR images reconstructed from axial data set.  CONTRAST:  100mL OMNIPAQUE IOHEXOL 300 MG/ML SOLN IV. Dilute oral contrast.  COMPARISON:  05/22/2014  FINDINGS: Small bibasilar pleural effusions and atelectasis.  BILATERAL renal cysts.  Gallbladder surgically absent.  Triangular perfusion defect at mid spleen compatible with splenic infarct, 15 x 16 mm image 22.  Additional questionable triangular defect at anterior spleen image 20 appears to be present on the previous exam, question fold.  Liver, spleen, pancreas, kidneys, and adrenal glands otherwise normal.  Retro aortic LEFT renal vein.  Large duodenal diverticulum at medial second portion adjacent to pancreatic head.  Prostatic enlargement 5.4 x 4.8 cm image 89.  Bladder and ureters normal appearance.  Scattered atherosclerotic calcifications.  Normal appendix.  Sigmoid colon extends into a large RIGHT inguinal hernia without definite evidence of bowel obstruction.  Fluid is also identified within the RIGHT inguinal canal.  Sigmoid colon entering the hernia sac in the RIGHT inguinal region demonstrates mild wall thickening which could reflect colitis, postoperative change, or incarceration.  No evidence of bowel perforation or abscess.  Minimal residual fluid  and infiltrative changes within abdomen particularly in the mesentery in RIGHT mid abdomen though overall the infiltrative changes appear much improved from the previous study, with none of the residual fluid demonstrating demarcation or wall thickening or enhancement to suggest mature abscess.  Minimal bowel wall thickening of a single small bowel loop in the RIGHT mid abdomen.  Single prominent small bowel loop in LEFT mid abdomen 3.2 cm diameter, likely postsurgical.  Small fat containing LEFT inguinal hernia.  No mass, adenopathy, or ascites.  Bones demineralized.  IMPRESSION: Residual fluid and tiny foci of gas within the mesentery of the RIGHT mid abdomen at site of prior inflammatory process, improved since previous exam, likely representing postsurgical and postinflammatory changes without discrete residual demarcated fluid collection to suggest abscess at this point.  Segment of sigmoid colon extends into a large RIGHT inguinal hernia, associated with minimal bowel wall thickening which may represent colitis, postoperative change or reflect incarceration, little changed from previous study.  Prostatic enlargement.  Small splenic infarct.   Electronically Signed   By: Ulyses SouthwardMark  Boles M.D.   On: 06/01/2014 17:44   Dg Chest Port 1 View  06/01/2014   CLINICAL DATA:  Shortness of breath with exertion, leukocytosis, prior abdominal surgery 11  days ago, fluid retention, personal history of myasthenia gravis, hypertension, former smoker  EXAM: PORTABLE CHEST - 1 VIEW  COMPARISON:  Portable exam 0954 hr compared to 05/23/2014  FINDINGS: LEFT subclavian line stable tip projecting over brachiocephalic vein at/near SVC confluence.  Enlargement of cardiac silhouette.  Mediastinal contours and pulmonary vascularity normal.  Minimal bibasilar atelectasis.  Upper lungs clear.  No pleural effusion or pneumothorax.  Scattered endplate spur formation thoracic spine.  IMPRESSION: Minimal bibasilar atelectasis without definite  acute infiltrate.   Electronically Signed   By: Ulyses Southward M.D.   On: 06/01/2014 10:07    Scheduled Meds: . antiseptic oral rinse  7 mL Mouth Rinse QID  . chlorhexidine  15 mL Mouth Rinse BID  . diltiazem  180 mg Oral Daily  . furosemide  60 mg Intravenous TID  . insulin aspart  0-15 Units Subcutaneous TID WC  . insulin aspart  0-5 Units Subcutaneous QHS  . metoprolol tartrate  50 mg Oral TID  . nystatin   Topical BID  . potassium chloride  20 mEq Oral TID  . predniSONE  10 mg Oral Q breakfast  . warfarin  2.5 mg Oral ONCE-1800  . warfarin   Does not apply Once  . Warfarin - Pharmacist Dosing Inpatient   Does not apply q1800   Continuous Infusions:   Active Problems:   HTN (hypertension)   PAF (paroxysmal atrial fibrillation)   S/P small bowel resection Nov 2015    Time spent: >35 minutes     Esperanza Sheets  [facility] Pager [phone]. If 7PM-7AM, please contact night-coverage at [email or web address], password [facility] 06/02/2014, 1:35 PM  LOS: 11 days

## 2014-06-02 NOTE — Progress Notes (Signed)
ANTICOAGULATION CONSULT NOTE - Follow-Up Consult  Pharmacy Consult for Coumadin Indication: atrial fibrillation  No Known Allergies  Patient Measurements: Height: 6' (182.9 cm) Weight: 213 lb 10 oz (96.9 kg) IBW/kg (Calculated) : 77.6   Vital Signs: Temp: 98.1 F (36.7 C) (12/02 0605) Temp Source: Oral (12/02 0605) BP: 108/68 mmHg (12/02 0605) Pulse Rate: 71 (12/02 0605)  Labs:  Recent Labs  05/31/14 0540  05/31/14 1015 06/01/14 0530 06/02/14 0624 06/02/14 0625  HGB  --   < > 12.5* 12.5* 13.0  --   HCT  --   --  38.6* 37.4* 39.4  --   PLT  --   --  177 136* 108*  --   LABPROT 24.9*  --   --  29.3* 29.0*  --   INR 2.24*  --   --  2.74* 2.71*  --   CREATININE 0.96  --   --  1.03  --  1.18  < > = values in this interval not displayed.  Estimated Creatinine Clearance: 60.2 mL/min (by C-G formula based on Cr of 1.18).   Medical History: Past Medical History  Diagnosis Date  . Hypertension   . Myasthenia gravis   . Cellulitis   . Diverticula of small intestine     Assessment: 80yo male with diverticulitis of small intestine with perforation and bleeding s/p exploratory laparotomy and distal small bowel resection on 11/21. Found to have irregular rate and rhythm on 11/24. Cardiology [facility]) noted pt to have paroxysmal Afib, rate controlled with diltiazem, reluctant to start heparin or NOAC d/t recent surgery, started on warfarin 11/26. INR remains therapeutic at 2.71. Plt have continued to drop to 108 since admission. Checked with RN again who noticed no signs of bleeding, thrombosis or skin necrosis. SubQ heparin was d/c'ed on 11/30.    Goal of Therapy:  INR 2-3 Monitor platelets by anticoagulation protocol: Yes  Plan: - Coumadin 2.5 mg x 1 dose today  - Daily INR - F/U CBC, PLT, s/sx of bleeding  Vinnie LevelBenjamin Dionysios Massman, PharmD., BCPS Clinical Pharmacist Pager [phone]

## 2014-06-02 NOTE — Clinical Social Work Note (Signed)
CSW attempted to contact pt's daughter, Angelica ChessmanMandy, regarding SNF bed offers. CSW unable to reach pt's daughter. CSW will continue to attempt to contact pt's daughter.  Marcelline Deistmily Harshil Cavallaro, LCSWA [phone]) Licensed Clinical Social Worker Orthopedics [phone]([id number]) and Surgical [phone]([id number])

## 2014-06-02 NOTE — Progress Notes (Signed)
Patient Profile: 80 y/o male admitted 11/21 with abd pain. CT findings c/w perforated diverticulitis and clinical picture concerning for worsening peritonitis. Now POD #11 exploratory lap and SB resection. Cardiology consulted 11/24 for new onset AFIB with RVR. 2D echo with EF 45-50% and G1DD. No significant interval change since prior study. Now on PO Metoprolol and Cardizem for rate control and Wafarin for stroke prophylaxis.   Subjective: Feels better today. No increased dyspnea. Still on 2L. Just finished PT session.   Objective: Vital signs in last 24 hours: Temp:  [97.5 F (36.4 C)-98.6 F (37 C)] 98.1 F (36.7 C) (12/02 0605) Pulse Rate:  [1-94] 71 (12/02 0605) Resp:  [18] 18 (12/02 0605) BP: (108-127)/(62-69) 108/68 mmHg (12/02 0605) SpO2:  [94 %-97 %] 94 % (12/02 0605) Weight:  [213 lb 10 oz (96.9 kg)] 213 lb 10 oz (96.9 kg) (12/01 1431) Last BM Date: 06/27/14  Intake/Output from previous day: 12/01 0701 - 12/02 0700 In: 840 [P.O.:840] Out: 3375 [Urine:3175; Emesis/NG output:200] Intake/Output this shift: Total I/O In: 240 [P.O.:240] Out: -   Medications Current Facility-Administered Medications  Medication Dose Route Frequency Provider Last Rate Last Dose  . albuterol (PROVENTIL) (2.5 MG/3ML) 0.083% nebulizer solution 2.5 mg  2.5 mg Nebulization Q3H PRN Bernadene PersonKathryn A Whiteheart, NP      . antiseptic oral rinse (CPC / CETYLPYRIDINIUM CHLORIDE 0.05%) solution 7 mL  7 mL Mouth Rinse QID Alyson ReedyWesam G Yacoub, MD   7 mL at 06/02/14 1136  . chlorhexidine (PERIDEX) 0.12 % solution 15 mL  15 mL Mouth Rinse BID Alyson ReedyWesam G Yacoub, MD   15 mL at 06/02/14 0800  . diltiazem (CARDIZEM CD) 24 hr capsule 180 mg  180 mg Oral Daily Dietrich PatesPaula Larya Charpentier V, MD   180 mg at 06/02/14 1000  . diphenhydrAMINE (BENADRYL) capsule 25 mg  25 mg Oral Q6H PRN Harriette Bouillonhomas Cornett, MD   25 mg at 06/01/14 2250  . furosemide (LASIX) injection 60 mg  60 mg Intravenous TID Pricilla RifflePaula Nijae Doyel V, MD   60 mg at 06/02/14 1134  .  HYDROcodone-acetaminophen (NORCO/VICODIN) 5-325 MG per tablet 1-2 tablet  1-2 tablet Oral Q4H PRN Megan N Dort, PA-C      . insulin aspart (novoLOG) injection 0-15 Units  0-15 Units Subcutaneous TID WC Joseph ArtJessica U Vann, DO   5 Units at 06/02/14 0800  . insulin aspart (novoLOG) injection 0-5 Units  0-5 Units Subcutaneous QHS Joseph ArtJessica U Vann, DO   0 Units at 06/01/14 2251  . metoprolol (LOPRESSOR) tablet 50 mg  50 mg Oral TID Pricilla RifflePaula Scotty Pinder V, MD   50 mg at 06/02/14 1000  . morphine 2 MG/ML injection 1-3 mg  1-3 mg Intravenous Q4H PRN Megan N Dort, PA-C      . nystatin (MYCOSTATIN/NYSTOP) topical powder   Topical BID Barnetta ChapelKelly Osborne, PA-C      . ondansetron [facility](ZOFRAN) tablet 4 mg  4 mg Oral Q6H PRN Atilano InaEric M Wilson, MD       Or  . ondansetron [facility](ZOFRAN) injection 4 mg  4 mg Intravenous Q6H PRN Atilano InaEric M Wilson, MD   4 mg at 06/01/14 1802  . potassium chloride SA (K-DUR,KLOR-CON) CR tablet 20 mEq  20 mEq Oral TID Pricilla RifflePaula Nazire Fruth V, MD   20 mEq at 06/02/14 1000  . predniSONE (DELTASONE) tablet 10 mg  10 mg Oral Q breakfast Valarie MerinoMatthew B Martin, MD   10 mg at 06/02/14 0800  . sodium chloride 0.9 % injection 10-40 mL  10-40 mL Intracatheter PRN  Atilano InaEric M Wilson, MD   10 mL at 06/02/14 0857  . warfarin (COUMADIN) tablet 2.5 mg  2.5 mg Oral ONCE-1800 Candis SchatzBenjamin G Mancheril, RPH      . warfarin (COUMADIN) video   Does not apply Once Waynette Butteryegan K Magsam, RPH   Stopped at 05/28/14 1130  . Warfarin - Pharmacist Dosing Inpatient   Does not apply q1800 Tegan K Magsam, RPH        PE: General appearance: alert, cooperative and no distress Neck: no carotid bruit and no JVD Lungs: clear to auscultation bilaterally Heart: irregularly irregular rhythm Extremities: 3+ bilateral LEE Pulses: 2+ and symmetric Skin: warm and dry Neurologic: Grossly normal  Lab Results:   Recent Labs  05/31/14 1015 06/01/14 0530 06/02/14 0624  WBC 21.2* 22.3* 22.3*  HGB 12.5* 12.5* 13.0  HCT 38.6* 37.4* 39.4  PLT 177 136* 108*   BMET  Recent Labs   05/31/14 0540 06/01/14 0530 06/02/14 0625  NA 141 136* 140  K 3.8 3.4* 3.8  CL 103 100 100  CO2 25 23 25   GLUCOSE 213* 204* 166*  BUN 18 18 18   CREATININE 0.96 1.03 1.18  CALCIUM 7.7* 7.6* 7.5*   PT/INR  Recent Labs  05/31/14 0540 06/01/14 0530 06/02/14 0624  LABPROT 24.9* 29.3* 29.0*  INR 2.24* 2.74* 2.71*   Filed Weights   05/30/14 0500 05/31/14 0500 06/01/14 1431  Weight: 221 lb 9 oz (100.5 kg) 222 lb 7.1 oz (100.9 kg) 213 lb 10 oz (96.9 kg)     Assessment/Plan  Active Problems:   HTN (hypertension)   PAF (paroxysmal atrial fibrillation)   S/P small bowel resection Nov 2015  1. Atrial Fibrillation: Rate control agents were adjusted today (increase in Metoprolol to 50 mg TID and decrease in Cardizem to 180 daily, given LV dysfunction). His resting rate was well controlled earlier in the 70s but now elevated in the 120s after completing physical therapy. Will reassess HR after period of resting to see if he will need further increases in his BB. Continue on telemetry.   2. Anticoagulation: On warfarin for stroke prophylaxis. INR is therapeutic at 2.71. Goal is 2-3.  3. Combined systolic/diastolic CHF: Still significantly volume overloaded, but slowly diuresing. Negative 3.3L in past 24 hrs. Weight is down 9 lb since yesterday. Renal function remains stable. Electrolytes also stable with K at 3.8. Continue IV Lasix, daily BMPs, strict I/Os daily weights, low sodium diet and potassium supplementation as needed.   4. Leukocytosis: WBC remains elevated and unchanged since yesterday at 22.3. He is afebrile. He has been on steroids chronically and baseline WBC count 14-15. IM following and conducting w/u.   MD to follow with further recommendations.    LOS: 11 days    Brittainy M. Delmer IslamSimmons, PA-C 06/02/2014 11:41 AM Patient seen and examined.  Agree with findings as noted by B Simmons HR still high on average  WIll increase lopressor Volume improving  Need strict I/O   Will increase lasix to 80 tid  Watch electrolytes  CMET in AM    Dietrich PatesPaula Oz Gammel

## 2014-06-02 NOTE — Progress Notes (Signed)
LOS: 11 days   Subjective: Ambulatory in hall with PT this morning. Anasarca improving. 3L of UO over last 24 hours. When patient asked how he was doing today, he responded "bad" but when asked what was wrong he stated "nothing." Denies pain, nausea, vomiting. Had bowel movement this morning.    Objective: Vital signs in last 24 hours: remains afebrile Temp:  [97.5 F (36.4 C)-98.6 F (37 C)] 98.1 F (36.7 C) (12/02 0605) Pulse Rate:  [1-94] 71 (12/02 0605) Resp:  [18] 18 (12/02 0605) BP: (108-127)/(62-69) 108/68 mmHg (12/02 0605) SpO2:  [94 %-97 %] 94 % (12/02 0605) Weight:  [96.9 kg (213 lb 10 oz)] 96.9 kg (213 lb 10 oz) (12/01 1431) Last BM Date: 06/27/14   Laboratory  CBC  Recent Labs  06/01/14 0530 06/02/14 0624  WBC 22.3* 22.3*  HGB 12.5* 13.0  HCT 37.4* 39.4  PLT 136* 108*   BMET  Recent Labs  06/01/14 0530 06/02/14 0625  NA 136* 140  K 3.4* 3.8  CL 100 100  CO2 23 25  GLUCOSE 204* 166*  BUN 18 18  CREATININE 1.03 1.18  CALCIUM 7.6* 7.5*     Physical Exam General appearance: alert, cooperative, appears stated age and no distress Resp: clear to auscultation bilaterally Cardio: irregularly irregular rhythm and with tachycardia GI: soft, NABS, non-tender, ND, incision with clean nonerythematous wound edges and packed with saline gauze Extremities: 3+ bilateral lower extremity edema   Assessment/Plan:  HD 11, POD 11 s/p ex lap with SBR for diverticulitis of small bowel  1.PAF: cardiology service adjusting oral rate control agents as patient still having episodes of tachycardia with ambulation (HR 120-140).  2. Anticoagulation: on warfarin with therapeutic INR of 2.71 today. 3. Leukocytosis: WBC remains elevated and unchanged since yesterday at 22.3. He is afebrile. He has been on steroids chronically for myasthenia gravis and baseline WBC count 14-15. IM following and conducting w/u. CT A/P from 06/01/2014 is without worrisome change from 05/22/2014  scan. Only notable for expected post operative findings.  4. Will D/C central line, advance diet, encourage continued ambulation.    Berniece PapJ. Lee Latanga Nedrow, [facility] LtdA-C [facility]  Pager [phone]  06/02/2014

## 2014-06-02 NOTE — Progress Notes (Signed)
Physical Therapy Treatment Patient Details Name: Erik GoodellCharlie W [name] MRN: [id number] DOB: 02/08/1932 Today's Date: 06/02/2014    History of Present Illness 80 yo WM came to ED for persistent and worsening generalized abdominal pain. Found to have diverticulitis with small intestine perforation s/p exp lap with small bowel resection 11/21. Pt with hx of myasthenia gravis. New onset Afib and scrotal edema    PT Comments    Pt making slow progress with therapy, but as session continued, he was able to increase ambulation distance.  Family and pt state that he has not been up very much at all since being in hospital.  Ambulated several bouts during session with HR continuing to increase to 140's and down to upper 40's during session.  PA notified.  Note that following pt using restroom and assisting with peri care, pt with multiple areas of skin breakdown, therefore recommend that pt get back into bed to decrease pressure.  Educated family that he would need to turn every two hours in bed to decrease skin breakdown.  Family verbalized understanding.  Continue to recommend SNF for follow up and family in agreement.   Follow Up Recommendations  SNF;Supervision/Assistance - 24 hour     Equipment Recommendations  Rolling walker with 5" wheels    Recommendations for Other Services       Precautions / Restrictions Precautions Precautions: Fall Precaution Comments: watch HR, O2 sats Restrictions Weight Bearing Restrictions: No Other Position/Activity Restrictions: Careful of having him sit in chair, lots of skin breakdown    Mobility  Bed Mobility Overal bed mobility: Needs Assistance Bed Mobility: Sit to Supine       Sit to supine: Mod assist   General bed mobility comments: Assist for BLEs into bed, pt with good control of trunk into bed.  cues for safety and hand placement.    Transfers Overall transfer level: Needs assistance Equipment used: Rolling walker (2 wheeled) Transfers:  Sit to/from Stand Sit to Stand: Min assist         General transfer comment: Min A to stand from recliner and mod A to stand from low toilet.  Cues for hand placement, getting to front of chair before standing.    Ambulation/Gait Ambulation/Gait assistance: Min guard Ambulation Distance (Feet): 50 Feet (then another 20' x 1, 45' x 2 reps) Assistive device: Rolling walker (2 wheeled) Gait Pattern/deviations: Step-through pattern;Decreased stride length;Trunk flexed Gait velocity: decreased   General Gait Details: Pt continues to have slow gait speed, but is more limited by decreased endurance.  Pts HR up to 140's at times and then down to upper 40's at lowest.  SaO2 remained in 90's throughout on 2LO2.  PA notified.     Stairs            Wheelchair Mobility    Modified Rankin (Stroke Patients Only)       Balance Overall balance assessment: Needs assistance Sitting-balance support: Feet supported;Single extremity supported Sitting balance-Leahy Scale: Good     Standing balance support: During functional activity;Bilateral upper extremity supported Standing balance-Leahy Scale: Poor                      Cognition Arousal/Alertness: Awake/alert Behavior During Therapy: WFL for tasks assessed/performed Overall Cognitive Status: Within Functional Limits for tasks assessed                      Exercises      General Comments General comments (skin integrity, edema,  etc.): Pt continues to have very thin and weeping skin.        Pertinent Vitals/Pain Pain Assessment: 0-10 Pain Score: 0-No pain    Home Living                      Prior Function            PT Goals (current goals can now be found in the care plan section) Acute Rehab PT Goals Patient Stated Goal: return to taking care of myself PT Goal Formulation: With patient Time For Goal Achievement: 06/08/14 Potential to Achieve Goals: Good Progress towards PT goals:  Progressing toward goals    Frequency  Min 3X/week    PT Plan Discharge plan needs to be updated    Co-evaluation             End of Session Equipment Utilized During Treatment: Oxygen Activity Tolerance: Patient tolerated treatment well;Patient limited by fatigue Patient left: in bed;with call bell/phone within reach;with family/visitor present     Time: [id number] PT Time Calculation (min) (ACUTE ONLY): 49 min  Charges:  $Gait Training: 23-37 mins $Therapeutic Activity: 8-22 mins                    G Codes:      Vista Deckarcell, Madysun Thall Ann 06/02/2014, 12:33 PM

## 2014-06-03 DIAGNOSIS — D696 Thrombocytopenia, unspecified: Secondary | ICD-10-CM

## 2014-06-03 DIAGNOSIS — D72829 Elevated white blood cell count, unspecified: Secondary | ICD-10-CM | POA: Insufficient documentation

## 2014-06-03 DIAGNOSIS — I4819 Other persistent atrial fibrillation: Secondary | ICD-10-CM | POA: Insufficient documentation

## 2014-06-03 LAB — BASIC METABOLIC PANEL
Anion gap: 15 (ref 5–15)
BUN: 21 mg/dL (ref 6–23)
CO2: 26 mEq/L (ref 19–32)
CREATININE: 1.25 mg/dL (ref 0.50–1.35)
Calcium: 7.8 mg/dL — ABNORMAL LOW (ref 8.4–10.5)
Chloride: 94 mEq/L — ABNORMAL LOW (ref 96–112)
GFR calc Af Amer: 61 mL/min — ABNORMAL LOW (ref >90)
GFR, EST NON AFRICAN AMERICAN: 53 mL/min — AB (ref >90)
Glucose, Bld: 182 mg/dL — ABNORMAL HIGH (ref 70–99)
Potassium: 3.7 mEq/L (ref 3.7–5.3)
Sodium: 135 mEq/L — ABNORMAL LOW (ref 137–147)

## 2014-06-03 LAB — COMPREHENSIVE METABOLIC PANEL
ALBUMIN: 2 g/dL — AB (ref 3.5–5.2)
ALK PHOS: 65 U/L (ref 39–117)
ALT: 48 U/L (ref 0–53)
ANION GAP: 11 (ref 5–15)
AST: 63 U/L — ABNORMAL HIGH (ref 0–37)
BUN: 21 mg/dL (ref 6–23)
CHLORIDE: 96 meq/L (ref 96–112)
CO2: 29 mEq/L (ref 19–32)
CREATININE: 1.32 mg/dL (ref 0.50–1.35)
Calcium: 7.8 mg/dL — ABNORMAL LOW (ref 8.4–10.5)
GFR calc non Af Amer: 49 mL/min — ABNORMAL LOW (ref >90)
GFR, EST AFRICAN AMERICAN: 57 mL/min — AB (ref >90)
GLUCOSE: 174 mg/dL — AB (ref 70–99)
Potassium: 4.1 mEq/L (ref 3.7–5.3)
Sodium: 136 mEq/L — ABNORMAL LOW (ref 137–147)
Total Bilirubin: 0.5 mg/dL (ref 0.3–1.2)
Total Protein: 5.1 g/dL — ABNORMAL LOW (ref 6.0–8.3)

## 2014-06-03 LAB — CBC WITH DIFFERENTIAL/PLATELET
BASOS PCT: 0 % (ref 0–1)
Basophils Absolute: 0 10*3/uL (ref 0.0–0.1)
Eosinophils Absolute: 0.1 10*3/uL (ref 0.0–0.7)
Eosinophils Relative: 0 % (ref 0–5)
HEMATOCRIT: 37.2 % — AB (ref 39.0–52.0)
HEMOGLOBIN: 12.2 g/dL — AB (ref 13.0–17.0)
LYMPHS ABS: 2.7 10*3/uL (ref 0.7–4.0)
Lymphocytes Relative: 14 % (ref 12–46)
MCH: 30.7 pg (ref 26.0–34.0)
MCHC: 32.8 g/dL (ref 30.0–36.0)
MCV: 93.5 fL (ref 78.0–100.0)
MONO ABS: 0.9 10*3/uL (ref 0.1–1.0)
MONOS PCT: 5 % (ref 3–12)
NEUTROS ABS: 15.1 10*3/uL — AB (ref 1.7–7.7)
Neutrophils Relative %: 81 % — ABNORMAL HIGH (ref 43–77)
Platelets: 86 10*3/uL — ABNORMAL LOW (ref 150–400)
RBC: 3.98 MIL/uL — AB (ref 4.22–5.81)
RDW: 14.9 % (ref 11.5–15.5)
WBC: 18.8 10*3/uL — ABNORMAL HIGH (ref 4.0–10.5)

## 2014-06-03 LAB — PROTIME-INR
INR: 2.57 — ABNORMAL HIGH (ref 0.00–1.49)
Prothrombin Time: 27.8 seconds — ABNORMAL HIGH (ref 11.6–15.2)

## 2014-06-03 LAB — GLUCOSE, CAPILLARY
Glucose-Capillary: 152 mg/dL — ABNORMAL HIGH (ref 70–99)
Glucose-Capillary: 198 mg/dL — ABNORMAL HIGH (ref 70–99)

## 2014-06-03 MED ORDER — HYDROCODONE-ACETAMINOPHEN 5-325 MG PO TABS: 1.0000 | ORAL_TABLET | ORAL | Status: DC | PRN

## 2014-06-03 MED ORDER — WARFARIN SODIUM 2 MG PO TABS: 2.0000 mg | ORAL_TABLET | Freq: Every day | ORAL | Status: DC

## 2014-06-03 MED ORDER — METOPROLOL TARTRATE 50 MG PO TABS: 50.0000 mg | ORAL_TABLET | Freq: Four times a day (QID) | ORAL | Status: DC

## 2014-06-03 MED ORDER — WARFARIN SODIUM 2.5 MG PO TABS
2.5000 mg | ORAL_TABLET | Freq: Once | ORAL | Status: DC
  Filled 2014-06-03: qty 1

## 2014-06-03 MED ORDER — FUROSEMIDE 10 MG/ML IJ SOLN
40.0000 mg | Freq: Every day | INTRAMUSCULAR | Status: DC
  Administered 2014-06-03: 40 mg via INTRAVENOUS

## 2014-06-03 MED ORDER — FUROSEMIDE 20 MG PO TABS: 40.0000 mg | ORAL_TABLET | Freq: Two times a day (BID) | ORAL | Status: DC

## 2014-06-03 MED ORDER — DILTIAZEM HCL ER COATED BEADS 180 MG PO CP24: 180.0000 mg | ORAL_CAPSULE | Freq: Every day | ORAL | Status: AC

## 2014-06-03 MED ORDER — POTASSIUM CHLORIDE CRYS ER 20 MEQ PO TBCR: 20.0000 meq | EXTENDED_RELEASE_TABLET | Freq: Three times a day (TID) | ORAL | Status: DC

## 2014-06-03 NOTE — Discharge Summary (Deleted)
Patient ID: [name] MRN: [id number] DOB/AGE: 78/03/1932 78 y.o.  Admit date: 05/22/2014 Discharge date: 06/03/2014  Procedures: Exploratory laparotomy with distal small bowel resection with side-to-side anastomosis by Dr. Andrey CampanileWilson on 05-22-14  Consults: cardiology, pulmonary/intensive care and internal medicine  Reason for Admission: 80 yo obese WM came to ED earlier tonight for persistent and worsening generalized abdominal pain. Pt states he woke up Thursday AM with RLQ abd pain that then spread throughout abdomen and was constant. The pain has gotten progressively worse. He also relates a change in his bowel function. He has become constipated - last BM Thursday. No flatus today. No vomiting or f/c. Also reports some difficulty urinating. Has sensation to void but just dribbles. Takes plavix daily. In ED had labs and CT and [facility] was called to evaluate.   He has prior history of SB diverticulitis that perforated in 2006 requiring SB resection by Dr Abbey Chattersosenbower. Path showed no evidence of IBD.   Admission Diagnoses:  Perforated Distal Small Bowel Diverticulitis HTN Myasthenia Gravis Large RIH Elevated Creatinine Mild protein calorie malnutrition  Hospital Course: The patient was admitted and taken to the OR where he underwent the above procedure.  He remained intubated post operatively and was taken to the ICU where CCM was asked to assist with vent management.  He was able to be extubated on POD 1, but was requiring [place] with O2 at 5L.  He was unable to be completely weaned off of oxygen by time of discharge on POD 12.  He was discharged on 2L of O2.  Post operatively, he had an NGT in place.  He had a mild post op ileus.  He began passing flatus on POD 4.  His NGT was removed and he was started on clear liquids.  He tolerated these well and his diet was able to be advanced as tolerated without any issues.  His abdominal incision was left with the skin open.  This underwent NS WD  dressing changes throughout the hospital stay and was clean at the time of discharge.  His WBC did rise on POD 9.  After work a negative work up with CT abd/pel, CXR, and UA, his central line was removed and his WBC began to trend down.  On POD 3, he went into new onset a fib.  He was started on a diltiazem drip after cardiology saw the patient.  He remained in atrial fibrillation.  Once he was able to tolerated orals, he was switched to oral Cardizem and lopressor was added also for rate control.  Coumadin was also started and managed by pharmacy.  He also was noted to be fluid overloaded and his daily lasix was increased to start diuresing the patient.  He tolerated this well.  His discharge creatinine is 1.32.  He was continued on IV steroids while unable to take in orals for his myasthenia gravis.  These were transitioned back to his normal oral dose once able to take in oral meds.  Otherwise, the patient remained medically stable throughout his admission.  He is deconditioned and will need to go to SNF for rehab prior to home.   Discharge Diagnoses:  Active Problems:   HTN (hypertension)   PAF (paroxysmal atrial fibrillation)   S/P small bowel resection Nov 2015 AKI, resolved ARF, improving, dc on 2L [place] Myasthenia gravis Deconditioning   Discharge Medications:   Medication List    STOP taking these medications        amLODipine 5 MG tablet  Commonly known as:  NORVASC     irbesartan 150 MG tablet  Commonly known as:  AVAPRO     potassium phosphate (monobasic) 500 MG tablet  Commonly known as:  K-PHOS ORIGINAL      TAKE these medications        alendronate 70 MG tablet  Commonly known as:  FOSAMAX  Take 70 mg by mouth every Sunday. Take with a full glass of water on an empty stomach.     COMBIGAN 0.2-0.5 % ophthalmic solution  Generic drug:  brimonidine-timolol  Place 1 drop into the left eye every 12 (twelve) hours.     diltiazem 180 MG 24 hr capsule  Commonly  known as:  CARDIZEM CD  Take 1 capsule (180 mg total) by mouth daily.     furosemide 20 MG tablet  Commonly known as:  LASIX  Take 40 mg by mouth daily.     HYDROcodone-acetaminophen 5-325 MG per tablet  Commonly known as:  NORCO/VICODIN  Take 1-2 tablets by mouth every 4 (four) hours as needed for moderate pain or severe pain.     metoprolol 50 MG tablet  Commonly known as:  LOPRESSOR  Take 1 tablet (50 mg total) by mouth 4 (four) times daily.     OVER THE COUNTER MEDICATION  Take 1 tablet by mouth every morning. antacid     potassium chloride SA 20 MEQ tablet  Commonly known as:  K-DUR,KLOR-CON  Take 1 tablet (20 mEq total) by mouth 3 (three) times daily.     predniSONE 10 MG tablet  Commonly known as:  DELTASONE  Take 10 mg by mouth daily.     sildenafil 20 MG tablet  Commonly known as:  REVATIO  Take 20-40 mg by mouth daily as needed (1 hour prior to sexual intercourse).     warfarin 2 MG tablet  Commonly known as:  COUMADIN  Take 1 tablet (2 mg total) by mouth daily.     white petrolatum Gel  Commonly known as:  VASELINE  Apply 1 application topically as needed (to where skin was removed).        Discharge Instructions:     Follow-up Information    Follow up with Atilano InaWILSON,ERIC M, MD.   Specialty:  General Surgery   Why:  our office will call you and give you your appointment time   Contact information:   [address] [place] [address] [phone]       Follow up with Josue HectorNYLAND,LEONARD ROBERT, MD In 3 weeks.   Specialty:  Family Medicine   Why:  When you get out of the skilled nursing facility   Contact information:   [address] [place] [phone]     Cardiology follow up pending  Signed: Insiya Oshea E 06/03/2014, 8:41 AM

## 2014-06-03 NOTE — Progress Notes (Addendum)
Patient Profile: 80 y/o male admitted 11/21 with abd pain. CT findings c/w perforated diverticulitis and clinical picture concerning for worsening peritonitis. Now POD #11 exploratory lap and SB resection. Cardiology consulted 11/24 for new onset AFIB with RVR. 2D echo with EF 45-50% and G1DD. No significant interval change since prior study. Now on PO Metoprolol and Cardizem for rate control and Wafarin for stroke prophylaxis.   Subjective: Feels pretty good. No complaints.   Objective: Vital signs in last 24 hours: Temp:  [98.1 F (36.7 C)-98.5 F (36.9 C)] 98.5 F (36.9 C) (12/03 0550) Pulse Rate:  [94-114] 96 (12/03 0550) Resp:  [18] 18 (12/03 0550) BP: (107-114)/(58-69) 107/69 mmHg (12/03 0550) SpO2:  [94 %-97 %] 94 % (12/03 0905) Weight:  [215 lb 13.3 oz (97.9 kg)] 215 lb 13.3 oz (97.9 kg) (12/03 0550) Last BM Date: 06/02/14  Intake/Output from previous day: 12/02 0701 - 12/03 0700 In: 960 [P.O.:960] Out: 1700 [Urine:1700] Intake/Output this shift:    Medications Current Facility-Administered Medications  Medication Dose Route Frequency Provider Last Rate Last Dose  . albuterol (PROVENTIL) (2.5 MG/3ML) 0.083% nebulizer solution 2.5 mg  2.5 mg Nebulization Q3H PRN Bernadene PersonKathryn A Whiteheart, NP      . antiseptic oral rinse (CPC / CETYLPYRIDINIUM CHLORIDE 0.05%) solution 7 mL  7 mL Mouth Rinse QID Alyson ReedyWesam G Yacoub, MD   7 mL at 06/02/14 1600  . chlorhexidine (PERIDEX) 0.12 % solution 15 mL  15 mL Mouth Rinse BID Alyson ReedyWesam G Yacoub, MD   15 mL at 06/03/14 0833  . diltiazem (CARDIZEM CD) 24 hr capsule 180 mg  180 mg Oral Daily Dietrich PatesPaula Naileah Karg V, MD   180 mg at 06/02/14 1000  . diphenhydrAMINE (BENADRYL) capsule 25 mg  25 mg Oral Q6H PRN Harriette Bouillonhomas Cornett, MD   25 mg at 06/02/14 2343  . furosemide (LASIX) injection 40 mg  40 mg Intravenous Daily Barnetta ChapelKelly Osborne, PA-C      . HYDROcodone-acetaminophen (NORCO/VICODIN) 5-325 MG per tablet 1-2 tablet  1-2 tablet Oral Q4H PRN Megan N Dort, PA-C      .  insulin aspart (novoLOG) injection 0-15 Units  0-15 Units Subcutaneous TID WC Joseph ArtJessica U Vann, DO   3 Units at 06/03/14 [phone]  . insulin aspart (novoLOG) injection 0-5 Units  0-5 Units Subcutaneous QHS Joseph ArtJessica U Vann, DO   2 Units at 06/02/14 2247  . metoprolol (LOPRESSOR) tablet 50 mg  50 mg Oral QID Pricilla RifflePaula Journey Ratterman V, MD   50 mg at 06/02/14 2207  . morphine 2 MG/ML injection 1-3 mg  1-3 mg Intravenous Q4H PRN Megan N Dort, PA-C      . nystatin (MYCOSTATIN/NYSTOP) topical powder   Topical BID Barnetta ChapelKelly Osborne, PA-C      . ondansetron [facility](ZOFRAN) tablet 4 mg  4 mg Oral Q6H PRN Atilano InaEric M Wilson, MD       Or  . ondansetron [facility](ZOFRAN) injection 4 mg  4 mg Intravenous Q6H PRN Atilano InaEric M Wilson, MD   4 mg at 06/01/14 1802  . potassium chloride SA (K-DUR,KLOR-CON) CR tablet 20 mEq  20 mEq Oral TID Pricilla RifflePaula Tayon Parekh V, MD   20 mEq at 06/02/14 2207  . predniSONE (DELTASONE) tablet 10 mg  10 mg Oral Q breakfast Valarie MerinoMatthew B Martin, MD   10 mg at 06/03/14 [id number]  . sodium chloride 0.9 % injection 10-40 mL  10-40 mL Intracatheter PRN Atilano InaEric M Wilson, MD   10 mL at 06/02/14 0857  . warfarin (COUMADIN) video   Does  not apply Once Waynette Butteryegan K Magsam, RPH   Stopped at 05/28/14 1130  . Warfarin - Pharmacist Dosing Inpatient   Does not apply q1800 Tegan K Magsam, RPH        PE: General appearance: alert, cooperative and no distress Neck: no carotid bruit and no JVD Lungs: clear to auscultation bilaterally Heart: irregularly irregular rhythm Extremities: 3+ bilateral LEE Pulses: 2+ and symmetric Skin: warm and dry Neurologic: Grossly normal  Lab Results:   Recent Labs  06/01/14 0530 06/02/14 0624 06/03/14 0215  WBC 22.3* 22.3* 18.8*  HGB 12.5* 13.0 12.2*  HCT 37.4* 39.4 37.2*  PLT 136* 108* 86*   BMET  Recent Labs  06/02/14 0625 06/03/14 06/03/14 0215  NA 140 135* 136*  K 3.8 3.7 4.1  CL 100 94* 96  CO2 25 26 29   GLUCOSE 166* 182* 174*  BUN 18 21 21   CREATININE 1.18 1.25 1.32  CALCIUM 7.5* 7.8* 7.8*   PT/INR  Recent  Labs  06/01/14 0530 06/02/14 0624 06/03/14 0215  LABPROT 29.3* 29.0* 27.8*  INR 2.74* 2.71* 2.57*   Filed Weights   05/31/14 0500 06/01/14 1431 06/03/14 0550  Weight: 222 lb 7.1 oz (100.9 kg) 213 lb 10 oz (96.9 kg) 215 lb 13.3 oz (97.9 kg)     Assessment/Plan  Active Problems:   HTN (hypertension)   PAF (paroxysmal atrial fibrillation)   S/P small bowel resection Nov 2015  1. Atrial Fibrillation: Rate controled in the 90s. Asymptomatic. Continue metoprolol and Cardizem for rate control   2. Anticoagulation: On warfarin for stroke prophylaxis. INR is therapeutic at 2.57. Would hold tonight  Follow plt count, INR    3. Combined systolic/diastolic CHF: Still volume overloaded, but significantly improved. Diuresing well with IV Lasix. Renal function remains stable. Electrolytes also stable with K at 4.1. Plan is for SNF today. Recommend continuation of IV Lasix, daily BMPs, strict I/Os, daily weights, low sodium diet and potassium supplementation as needed. Will arrange f/u in our office.  4. Leukocytosis: WBC remains elevated improving and trending down. He remains afebrile. He has been on steroids chronically and baseline WBC count 14-15. W/u has been unremarkable including U/A: unremarkable; , chest x-ray with no PNA, small pleural effusions, no diarrhea and recent c diff negative, blood cultures NGTD, urine cultures neg; CT abd/pelvis: post op changes It is felt that leukocytosis may be related to steroids + recent surgery.   Dispo: I spoke with general surgery, who is serving as primary team. Plan is for hopeful d/c to SNF today. Patient is requesting to be followed by Dr. Tenny Crawoss long term. Post-hospital f/u has been arranged for 06/18/14.    LOS: 12 days    Brittainy M. Delmer IslamSimmons, PA-C 06/03/2014 9:38 AM   Patient seen and examined   Patient is improved from a volume standpoint  Still has extra volume on exam.  Note Alb 2. Which explains some of this   I would recomm lasix 40 bid  with close f/u of output, electrolytes  Plt today are 86 K  There has been a decline over the past few days  I would hold coumadin until they go above 100K   This will need close follow up  Cause for this trend is not clear.  I will make sure that patient has f/u in our clnic in [place] after d/c.  Dietrich PatesPaula Eesa Justiss

## 2014-06-03 NOTE — Clinical Social Work Note (Signed)
Pt to be discharged [facility]. Pt, pt's wife, and pt's daughter and son-in-law updated.  Facility[facility] Report number: [id number] Transportation: [employer]on [employer]  Taneya Conkel, [place] [phone]) Licensed Clinical Social Worker Orthopedics [phone]([id number]) and Surgical [phone]([id number])

## 2014-06-03 NOTE — Progress Notes (Signed)
[name] to be D/C'd Skilled nursing facility: [facility] per MD order.  Discussed with the patient and all questions fully answered.  VSS, Surgical site clean, dry with no sign of infection.  Wet to dry dressing change completed per order.  Weeping to right arm (wrapped) and stage 2 wound to sacrum (with foam in place) noted.    IV catheter discontinued intact. Site without signs and symptoms of complications. Dressing and pressure applied.  SNF packet completed by Child psychotherapistocial Worker and sent with EMS including prescription.  Patient instructed to return to ED, call 911, or call MD for any changes in condition.   Dr. Tenny Crawoss from Cardiology saw patient prior to discharge and discontinued Coumadin.  [facility] at [facility] notified of this change.  Patient escorted via stretcher and d/c to [facility] via EMS. Report called to Eunice Blaseebbie, [facility]  [name], [name] 06/03/2014 12:36 PM

## 2014-06-03 NOTE — Progress Notes (Signed)
Patient ID: [name], male   DOB: 07/27/1931, 80 y.o.   MRN: [id number] 12 Days Post-Op  Subjective: Pt feels well today.  Tolerating his diet with no complaints  Objective: Vital signs in last 24 hours: Temp:  [98.1 F (36.7 C)-98.5 F (36.9 C)] 98.5 F (36.9 C) (12/03 0550) Pulse Rate:  [94-114] 96 (12/03 0550) Resp:  [18] 18 (12/03 0550) BP: (107-114)/(58-69) 107/69 mmHg (12/03 0550) SpO2:  [96 %-97 %] 97 % (12/03 0550) Weight:  [215 lb 13.3 oz (97.9 kg)] 215 lb 13.3 oz (97.9 kg) (12/03 0550) Last BM Date: 06/02/14  Intake/Output from previous day: 12/02 0701 - 12/03 0700 In: 960 [P.O.:960] Out: 1700 [Urine:1700] Intake/Output this shift:    PE: Abd: soft, NT, ND, +BS, incision is clean and packed Heart: irregular, but not tachy Lungs: CTAB  Lab Results:   Recent Labs  06/02/14 0624 06/03/14 0215  WBC 22.3* 18.8*  HGB 13.0 12.2*  HCT 39.4 37.2*  PLT 108* 86*   BMET  Recent Labs  06/03/14 06/03/14 0215  NA 135* 136*  K 3.7 4.1  CL 94* 96  CO2 26 29  GLUCOSE 182* 174*  BUN 21 21  CREATININE 1.25 1.32  CALCIUM 7.8* 7.8*   PT/INR  Recent Labs  06/02/14 0624 06/03/14 0215  LABPROT 29.0* 27.8*  INR 2.71* 2.57*   CMP     Component Value Date/Time   NA 136* 06/03/2014 0215   K 4.1 06/03/2014 0215   CL 96 06/03/2014 0215   CO2 29 06/03/2014 0215   GLUCOSE 174* 06/03/2014 0215   BUN 21 06/03/2014 0215   CREATININE 1.32 06/03/2014 0215   CALCIUM 7.8* 06/03/2014 0215   PROT 5.1* 06/03/2014 0215   ALBUMIN 2.0* 06/03/2014 0215   AST 63* 06/03/2014 0215   ALT 48 06/03/2014 0215   ALKPHOS 65 06/03/2014 0215   BILITOT 0.5 06/03/2014 0215   GFRNONAA 49* 06/03/2014 0215   GFRAA 57* 06/03/2014 0215   Lipase     Component Value Date/Time   LIPASE 52 05/22/2014 0047       Studies/Results: Ct Abdomen Pelvis W Contrast  06/01/2014   CLINICAL DATA:  80 year old male with hypertension, myasthenia gravis, with diverticulitis post abdominal  surgery and bowel resection on 05/22/2014, now with postoperative leukocytosis  EXAM: CT ABDOMEN AND PELVIS WITH CONTRAST  TECHNIQUE: Multidetector CT imaging of the abdomen and pelvis was performed using the standard protocol following bolus administration of intravenous contrast. Sagittal and coronal MPR images reconstructed from axial data set.  CONTRAST:  100mL OMNIPAQUE IOHEXOL 300 MG/ML SOLN IV. Dilute oral contrast.  COMPARISON:  05/22/2014  FINDINGS: Small bibasilar pleural effusions and atelectasis.  BILATERAL renal cysts.  Gallbladder surgically absent.  Triangular perfusion defect at mid spleen compatible with splenic infarct, 15 x 16 mm image 22.  Additional questionable triangular defect at anterior spleen image 20 appears to be present on the previous exam, question fold.  Liver, spleen, pancreas, kidneys, and adrenal glands otherwise normal.  Retro aortic LEFT renal vein.  Large duodenal diverticulum at medial second portion adjacent to pancreatic head.  Prostatic enlargement 5.4 x 4.8 cm image 89.  Bladder and ureters normal appearance.  Scattered atherosclerotic calcifications.  Normal appendix.  Sigmoid colon extends into a large RIGHT inguinal hernia without definite evidence of bowel obstruction.  Fluid is also identified within the RIGHT inguinal canal.  Sigmoid colon entering the hernia sac in the RIGHT inguinal region demonstrates mild wall thickening which could reflect colitis,  postoperative change, or incarceration.  No evidence of bowel perforation or abscess.  Minimal residual fluid and infiltrative changes within abdomen particularly in the mesentery in RIGHT mid abdomen though overall the infiltrative changes appear much improved from the previous study, with none of the residual fluid demonstrating demarcation or wall thickening or enhancement to suggest mature abscess.  Minimal bowel wall thickening of a single small bowel loop in the RIGHT mid abdomen.  Single prominent small bowel  loop in LEFT mid abdomen 3.2 cm diameter, likely postsurgical.  Small fat containing LEFT inguinal hernia.  No mass, adenopathy, or ascites.  Bones demineralized.  IMPRESSION: Residual fluid and tiny foci of gas within the mesentery of the RIGHT mid abdomen at site of prior inflammatory process, improved since previous exam, likely representing postsurgical and postinflammatory changes without discrete residual demarcated fluid collection to suggest abscess at this point.  Segment of sigmoid colon extends into a large RIGHT inguinal hernia, associated with minimal bowel wall thickening which may represent colitis, postoperative change or reflect incarceration, little changed from previous study.  Prostatic enlargement.  Small splenic infarct.   Electronically Signed   By: Ulyses SouthwardMark  Boles M.D.   On: 06/01/2014 17:44   Dg Chest Port 1 View  06/01/2014   CLINICAL DATA:  Shortness of breath with exertion, leukocytosis, prior abdominal surgery 11 days ago, fluid retention, personal history of myasthenia gravis, hypertension, former smoker  EXAM: PORTABLE CHEST - 1 VIEW  COMPARISON:  Portable exam 0954 hr compared to 05/23/2014  FINDINGS: LEFT subclavian line stable tip projecting over brachiocephalic vein at/near SVC confluence.  Enlargement of cardiac silhouette.  Mediastinal contours and pulmonary vascularity normal.  Minimal bibasilar atelectasis.  Upper lungs clear.  No pleural effusion or pneumothorax.  Scattered endplate spur formation thoracic spine.  IMPRESSION: Minimal bibasilar atelectasis without definite acute infiltrate.   Electronically Signed   By: Ulyses SouthwardMark  Boles M.D.   On: 06/01/2014 10:07    Anti-infectives: Anti-infectives    Start     Dose/Rate Route Frequency Ordered Stop   05/22/14 0900  ciprofloxacin (CIPRO) IVPB 400 mg     400 mg200 mL/hr over 60 Minutes Intravenous Every 12 hours 05/22/14 0821 05/28/14 2247   05/22/14 0900  metroNIDAZOLE (FLAGYL) IVPB 500 mg     500 mg100 mL/hr over 60  Minutes Intravenous Every 8 hours 05/22/14 0821 05/29/14 0859   05/22/14 0300  ciprofloxacin (CIPRO) IVPB 400 mg     400 mg200 mL/hr over 60 Minutes Intravenous  Once 05/22/14 0253 05/22/14 0434   05/22/14 0300  metroNIDAZOLE (FLAGYL) IVPB 500 mg     500 mg100 mL/hr over 60 Minutes Intravenous  Once 05/22/14 0253 05/22/14 0434       Assessment/Plan   1. POD 12, s/p ex lap with SBR for diverticulitis of small bowel 2. ARF, improving, but on O2 3. AKI, resolved 4. Myasthenia gravis 5. Leukocytosis, down to 18K 6. A. Fib, on coumadin  Plan: 1. Patient doing well.  WBC down now that his central line has been removed.  I spoke to cardiology PA and she anticipates, he is stable from their standpoint for dc.  He is therapeutic on his coumadin.  She will arrange for cardiac and coumadin follow up. 2. He will need to cont BID dressing changes at the SNF 3. Follow up with Dr. Andrey CampanileWilson in 2-3 weeks 4. Stable for dc to SNF.  Will need to go with O2.  Spoke to daughter Olegario MessierKathy this morning who is going to  tell her mom to call Social work this morning to pick a bed.  He can go to the SNF when he has a bed.  LOS: 12 days    Pasco Marchitto E 06/03/2014, 8:22 AM Pager: [phone]

## 2014-06-03 NOTE — Clinical Social Work Note (Signed)
Updated discharge summary faxed to [facility].  Marcelline Deistmily Niurka Benecke, LCSWA [phone]) Licensed Clinical Social Worker Orthopedics [phone]([id number]) and Surgical [phone]([id number])

## 2014-06-03 NOTE — Discharge Summary (Signed)
Patient ID: Erik GoodellCharlie W Macphail MRN: [id number] DOB/AGE: 78/03/1932 78 y.o.  Admit date: 05/22/2014 Discharge date: 06/03/2014  Procedures: Exploratory laparotomy with distal small bowel resection with side-to-side anastomosis by Dr. Andrey CampanileWilson on 05-22-14  Consults: cardiology, pulmonary/intensive care and internal medicine  Reason for Admission: 80 yo obese WM came to ED earlier tonight for persistent and worsening generalized abdominal pain. Pt states he woke up Thursday AM with RLQ abd pain that then spread throughout abdomen and was constant. The pain has gotten progressively worse. He also relates a change in his bowel function. He has become constipated - last BM Thursday. No flatus today. No vomiting or f/c. Also reports some difficulty urinating. Has sensation to void but just dribbles. Takes plavix daily. In ED had labs and CT and [facility] was called to evaluate.   He has prior history of SB diverticulitis that perforated in 2006 requiring SB resection by Dr Abbey Chattersosenbower. Path showed no evidence of IBD.   Admission Diagnoses:  Perforated Distal Small Bowel Diverticulitis HTN Myasthenia Gravis Large RIH Elevated Creatinine Mild protein calorie malnutrition  Hospital Course: The patient was admitted and taken to the OR where he underwent the above procedure. He remained intubated post operatively and was taken to the ICU where CCM was asked to assist with vent management. He was able to be extubated on POD 1, but was requiring [place] with O2 at 5L. He was unable to be completely weaned off of oxygen by time of discharge on POD 12. He was discharged on 2L of O2.  Post operatively, he had an NGT in place. He had a mild post op ileus. He began passing flatus on POD 4. His NGT was removed and he was started on clear liquids. He tolerated these well and his diet was able to be advanced as tolerated without any issues. His abdominal incision was left with the skin open. This underwent NS WD  dressing changes throughout the hospital stay and was clean at the time of discharge. His WBC did rise on POD 9. After work a negative work up with CT abd/pel, CXR, and UA, his central line was removed and his WBC began to trend down.  On POD 3, he went into new onset a fib. He was started on a diltiazem drip after cardiology saw the patient. He remained in atrial fibrillation. Once he was able to tolerated orals, he was switched to oral Cardizem and lopressor was added also for rate control. Coumadin was also started and managed by pharmacy.  His platelets did drop to 86 on the day of discharge from 108 the day before.  This should be followed closely at the SNF and if this falls below 60K, then his coumadin should be held.  I cleared this with the internal medicine physician who was following with us.  He also was noted to be fluid overloaded and his daily lasix was increased to start diuresing the patient. He tolerated this well. His discharge creatinine is 1.32.  Cardiology, Dr. Tenny Crawoss, has recommended his lasix be increased to 40 MG BID with close follow up of his renal function with a BMET.  He was continued on IV steroids while unable to take in orals for his myasthenia gravis. These were transitioned back to his normal oral dose once able to take in oral meds.  Otherwise, the patient remained medically stable throughout his admission. He is deconditioned and will need to go to SNF for rehab prior to home.   Discharge Diagnoses:  Active Problems:  HTN (hypertension)  PAF (paroxysmal atrial fibrillation)  S/P small bowel resection Nov 2015 AKI, resolved ARF, improving, dc on 2L  Myasthenia gravis Deconditioning thrombocytopenia  Discharge Medications:   Medication List    STOP taking these medications       amLODipine 5 MG tablet  Commonly known as: NORVASC     irbesartan 150 MG tablet  Commonly known as: AVAPRO     potassium phosphate  (monobasic) 500 MG tablet  Commonly known as: K-PHOS ORIGINAL      TAKE these medications       alendronate 70 MG tablet  Commonly known as: FOSAMAX  Take 70 mg by mouth every Sunday. Take with a full glass of water on an empty stomach.     COMBIGAN 0.2-0.5 % ophthalmic solution  Generic drug: brimonidine-timolol  Place 1 drop into the left eye every 12 (twelve) hours.     diltiazem 180 MG 24 hr capsule  Commonly known as: CARDIZEM CD  Take 1 capsule (180 mg total) by mouth daily.     furosemide 20 MG tablet  Commonly known as: LASIX  Take 40 mg by mouth BID.     HYDROcodone-acetaminophen 5-325 MG per tablet  Commonly known as: NORCO/VICODIN  Take 1-2 tablets by mouth every 4 (four) hours as needed for moderate pain or severe pain.     metoprolol 50 MG tablet  Commonly known as: LOPRESSOR  Take 1 tablet (50 mg total) by mouth 4 (four) times daily.     OVER THE COUNTER MEDICATION  Take 1 tablet by mouth every morning. antacid     potassium chloride SA 20 MEQ tablet  Commonly known as: K-DUR,KLOR-CON  Take 1 tablet (20 mEq total) by mouth 3 (three) times daily.     predniSONE 10 MG tablet  Commonly known as: DELTASONE  Take 10 mg by mouth daily.     sildenafil 20 MG tablet  Commonly known as: REVATIO  Take 20-40 mg by mouth daily as needed (1 hour prior to sexual intercourse).     warfarin 2 MG tablet  Commonly known as: COUMADIN  Take 1 tablet (2 mg total) by mouth daily.     white petrolatum Gel  Commonly known as: VASELINE  Apply 1 application topically as needed (to where skin was removed).        Discharge Instructions:     Follow-up Information    Follow up with Atilano InaWILSON,ERIC M, MD.   Specialty: General Surgery   Why: our office will call you and give you your appointment time   Contact information:   [address] [place]  [address] [phone]       Follow up with Josue HectorNYLAND,LEONARD ROBERT, MD In 3 weeks.   Specialty: Family Medicine   Why: When you get out of the skilled nursing facility   Contact information:   [address] [place] [address] [phone]     Dr. Tenny Crawoss  10:10am on 06-18-14 for cardiology follow up.   Patient should get daily CBCs and BMETs to follow platelet count and Creatinine per cardiology.  If his platelets fall below 60K, his coumadin should be held.  Signed: Letha CapeSBORNE,Yanin Muhlestein E 06/03/2014, 14:06 PM

## 2014-06-03 NOTE — Consult Note (Signed)
[facility] PROGRESS NOTE  Lorrin GoodellCharlie W Gossman WUJ:[id number] DOB: 04/08/1932 DOA: 05/22/2014 PCP: Josue HectorNYLAND,LEONARD ROBERT, MD  Assessment/Plan: 80 year old male admitted on 11/21 with complaints of abdominal pain. CT scan findings showed perforated diverticulitis and a worsening peritonitis. Patient was intubated and cared for by PCCM in the beginning but was quickly extubated. Patient is now postop day #10 for exploratory laparotomy and small bowel resection. On 1124 patient developed new onset atrial fibrillation with RVR. Patient was started on Cardizem as well as warfarin. Patient has a history of myasthenia gravis and is on chronic prednisone at home. 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 Hospitalists were consulted as patient has developed an increasing white blood cell count but no fever.  1. Leukocytosis? related to steroids+recent surgery; on steroids chronically and baseline WBC count 14-15. -U/A: unremarkable; , chest x-ray with no PNA, small pleural effusions, no diarrhea and recent c diff negative, blood cultures NGTD, urine cultures neg; CT abd/pelvis: post op changes  -remains afebrile; repeat labs WBC improving;   2. Myasthenia gravis- continue prednisone 3. Acute resp failure post op/extubated, cont incentive spirometry, wean O2 off 4. Atrial fib- per cardiology; on BB, coumadin 5. Acute on chronic CHF, systolic HF; LVEF 40-45%; cont diuretics per cards;  6. S/p SB resection- per primary 7. 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  Code Status: full Family Communication: d/w patient (indicate person spoken with, relationship, and if by phone,  the number) Disposition Plan: per primary    Consultants:  surgery  Procedures:  abd surgery   Antibiotics:  none (indicate start date, and stop date if known)  HPI/Subjective: alert  Objective: Filed Vitals:   06/03/14 0550  BP: 107/69  Pulse: 96  Temp: 98.5 F (36.9 C)  Resp: 18    Intake/Output Summary (Last 24 hours) at 06/03/14 0856 Last data filed at 06/03/14 0554  Gross per 24 hour  Intake    960 ml  Output   1700 ml  Net   -740 ml   Filed Weights   05/31/14 0500 06/01/14 1431 06/03/14 0550  Weight: 100.9 kg (222 lb 7.1 oz) 96.9 kg (213 lb 10 oz) 97.9 kg (215 lb 13.3 oz)    Exam:   General:  alert  Cardiovascular: s1,s2 rrr  Respiratory: few crackles in LL  Abdomen: soft,mild tender, distended   Musculoskeletal: mild leg edema   Data Reviewed: Basic Metabolic Panel:  Recent Labs Lab 05/31/14 0540 06/01/14 0530 06/02/14 0625 06/03/14 06/03/14 0215  NA 141 136* 140 135* 136*  K 3.8 3.4* 3.8 3.7 4.1  CL 103 100 100 94* 96  CO2 25 23 25 26 29   GLUCOSE 213* 204* 166* 182* 174*  BUN 18 18 18 21 21   CREATININE 0.96 1.03 1.18 1.25 1.32  CALCIUM 7.7* 7.6* 7.5* 7.8* 7.8*   Liver Function Tests:  Recent Labs Lab 06/03/14 0215  AST 63*  ALT 48  ALKPHOS 65  BILITOT 0.5  PROT 5.1*  ALBUMIN 2.0*   No results for input(s): LIPASE, AMYLASE in the last 168 hours. No results for input(s): AMMONIA in the last 168 hours. CBC:  Recent Labs Lab 05/30/14  4098 05/31/14 1015 06/01/14 0530 06/02/14 0624 06/03/14 0215  WBC 18.2* 21.2* 22.3* 22.3* 18.8*  NEUTROABS  --   --   --  18.3* 15.1*  HGB 12.1* 12.5* 12.5* 13.0 12.2*  HCT 36.5* 38.6* 37.4* 39.4 37.2*  MCV 92.6 92.6 92.1 95.6 93.5  PLT 208 177 136* 108* 86*   Cardiac Enzymes: No results for input(s): CKTOTAL, CKMB, CKMBINDEX, TROPONINI in the last 168 hours. BNP (last 3 results) No results for input(s): PROBNP in the last 8760 hours. CBG:  Recent Labs Lab 06/02/14 0744  06/02/14 1226 06/02/14 1658 06/02/14 2214 06/03/14 0749  GLUCAP 203* 179* 208* 214* 152*    Recent Results (from the past 240 hour(s))  Clostridium Difficile by PCR     Status: None   Collection Time: 05/28/14  8:00 PM  Result Value Ref Range Status   C difficile by pcr NEGATIVE NEGATIVE Final     Studies: Ct Abdomen Pelvis W Contrast  06/01/2014   CLINICAL DATA:  80 year old male with hypertension, myasthenia gravis, with diverticulitis post abdominal surgery and bowel resection on 05/22/2014, now with postoperative leukocytosis  EXAM: CT ABDOMEN AND PELVIS WITH CONTRAST  TECHNIQUE: Multidetector CT imaging of the abdomen and pelvis was performed using the standard protocol following bolus administration of intravenous contrast. Sagittal and coronal MPR images reconstructed from axial data set.  CONTRAST:  OMNIPAQUE IOHEXOL 300 MG/ML SOLN IV. Dilute oral contrast.  COMPARISON:  05/22/2014  FINDINGS: Small bibasilar pleural effusions and atelectasis.  BILATERAL renal cysts.  Gallbladder surgically absent.  Triangular perfusion defect at mid spleen compatible with splenic infarct, 15 x 16 mm image 22.  Additional questionable triangular defect at anterior spleen image 20 appears to be present on the previous exam, question fold.  Liver, spleen, pancreas, kidneys, and adrenal glands otherwise normal.  Retro aortic LEFT renal vein.  Large duodenal diverticulum at medial second portion adjacent to pancreatic head.  Prostatic enlargement 5.4 x 4.8 cm image 89.  Bladder and ureters normal appearance.  Scattered atherosclerotic calcifications.  Normal appendix.  Sigmoid colon extends into a large RIGHT inguinal hernia without definite evidence of bowel obstruction.  Fluid is also identified within the RIGHT inguinal canal.  Sigmoid colon entering the hernia sac in the RIGHT inguinal region demonstrates mild wall thickening which could reflect colitis, postoperative change, or incarceration.  No  evidence of bowel perforation or abscess.  Minimal residual fluid and infiltrative changes within abdomen particularly in the mesentery in RIGHT mid abdomen though overall the infiltrative changes appear much improved from the previous study, with none of the residual fluid demonstrating demarcation or wall thickening or enhancement to suggest mature abscess.  Minimal bowel wall thickening of a single small bowel loop in the RIGHT mid abdomen.  Single prominent small bowel loop in LEFT mid abdomen 3.2 cm diameter, likely postsurgical.  Small fat containing LEFT inguinal hernia.  No mass, adenopathy, or ascites.  Bones demineralized.  IMPRESSION: Residual fluid and tiny foci of gas within the mesentery of the RIGHT mid abdomen at site of prior inflammatory process, improved since previous exam, likely representing postsurgical and postinflammatory changes without discrete residual demarcated fluid collection to suggest abscess at this point.  Segment of sigmoid colon extends into a large RIGHT inguinal hernia, associated with minimal bowel wall thickening which may represent colitis, postoperative change or reflect incarceration, little changed from previous study.  Prostatic enlargement.  Small splenic infarct.   Electronically Signed   By: Angelyn Punt.D.  On: 06/01/2014 17:44   Dg Chest Port 1 View  06/01/2014   CLINICAL DATA:  Shortness of breath with exertion, leukocytosis, prior abdominal surgery 11 days ago, fluid retention, personal history of myasthenia gravis, hypertension, former smoker  EXAM: PORTABLE CHEST - 1 VIEW  COMPARISON:  Portable exam 0954 hr compared to 05/23/2014  FINDINGS: LEFT subclavian line stable tip projecting over brachiocephalic vein at/near SVC confluence.  Enlargement of cardiac silhouette.  Mediastinal contours and pulmonary vascularity normal.  Minimal bibasilar atelectasis.  Upper lungs clear.  No pleural effusion or pneumothorax.  Scattered endplate spur formation thoracic  spine.  IMPRESSION: Minimal bibasilar atelectasis without definite acute infiltrate.   Electronically Signed   By: Ulyses SouthwardMark  Boles M.D.   On: 06/01/2014 10:07    Scheduled Meds: . antiseptic oral rinse  7 mL Mouth Rinse QID  . chlorhexidine  15 mL Mouth Rinse BID  . diltiazem  180 mg Oral Daily  . furosemide  40 mg Intravenous Daily  . insulin aspart  0-15 Units Subcutaneous TID WC  . insulin aspart  0-5 Units Subcutaneous QHS  . metoprolol tartrate  50 mg Oral QID  . nystatin   Topical BID  . potassium chloride  20 mEq Oral TID  . predniSONE  10 mg Oral Q breakfast  . warfarin   Does not apply Once  . Warfarin - Pharmacist Dosing Inpatient   Does not apply q1800   Continuous Infusions:   Active Problems:   HTN (hypertension)   PAF (paroxysmal atrial fibrillation)   S/P small bowel resection Nov 2015    Time spent: >35 minutes     Esperanza SheetsBURIEV, Johara Lodwick N  [facility] Pager [phone]. If 7PM-7AM, please contact night-coverage at [email or web address], password [email or web address] 06/03/2014, 8:56 AM  LOS: 12 days

## 2014-06-03 NOTE — Progress Notes (Signed)
ANTICOAGULATION CONSULT NOTE - Follow-Up Consult  Pharmacy Consult for Coumadin Indication: atrial fibrillation  No Known Allergies  Patient Measurements: Height: 6' (182.9 cm) Weight: 215 lb 13.3 oz (97.9 kg) IBW/kg (Calculated) : 77.6   Vital Signs: Temp: 98.5 F (36.9 C) (12/03 0550) Temp Source: Oral (12/03 0550) BP: 107/69 mmHg (12/03 0550) Pulse Rate: 96 (12/03 0550)  Labs:  Recent Labs  06/01/14 0530 06/02/14 0624 06/02/14 0625 06/03/14 06/03/14 0215  HGB 12.5* 13.0  --   --  12.2*  HCT 37.4* 39.4  --   --  37.2*  PLT 136* 108*  --   --  86*  LABPROT 29.3* 29.0*  --   --  27.8*  INR 2.74* 2.71*  --   --  2.57*  CREATININE 1.03  --  1.18 1.25 1.32    Estimated Creatinine Clearance: 54.1 mL/min (by C-G formula based on Cr of 1.32).   Medical History: Past Medical History  Diagnosis Date  . Hypertension   . Myasthenia gravis   . Cellulitis   . Diverticula of small intestine     Assessment: 80yo male with diverticulitis of small intestine with perforation and bleeding s/p exploratory laparotomy and distal small bowel resection on 11/21. Found to have irregular rate and rhythm on 11/24. Cardiology [facility](Hochrein) noted pt to have paroxysmal Afib, rate controlled with diltiazem and metoprolol, reluctant to start heparin or NOAC d/t recent surgery, started on warfarin 11/26. INR remains therapeutic at 2.57. Plt have continued to drop to 86 since admission. No bleeding noted. SubQ heparin was d/c'ed on 11/30.    Goal of Therapy:  INR 2-3 Monitor platelets by anticoagulation protocol: Yes  Plan: - Coumadin 2.5 mg x 1 dose today. Consider discharge on Coumadin 2.5 mg daily with f/u INR 3-5 days after discharge.  - Daily INR - F/U CBC, PLT, s/sx of bleeding  Vinnie LevelBenjamin Parnika Tweten, PharmD., BCPS Clinical Pharmacist Pager [phone]

## 2014-06-03 NOTE — Progress Notes (Signed)
Physical Therapy Treatment Patient Details Name: Erik GoodellCharlie W Spofford MRN: [id number] DOB: 03/14/1932 Today's Date: 06/03/2014    History of Present Illness 80 yo WM came to ED for persistent and worsening generalized abdominal pain. Found to have diverticulitis with small intestine perforation s/p exp lap with small bowel resection 11/21. Pt with hx of myasthenia gravis. New onset Afib and scrotal edema    PT Comments    Patient progressing slowly with mobility. Continues to demonstrate decreased endurance and dyspnea but able to maintain Sa02 >90 on RA. HR not as elevated today during exercise. Less assist to stand today when provided proper cues. Increased ambulation distance from prior session. Highly motivated. Will continue to follow and progress as tolerated.   Follow Up Recommendations  SNF;Supervision/Assistance - 24 hour     Equipment Recommendations  Rolling walker with 5" wheels    Recommendations for Other Services       Precautions / Restrictions Precautions Precautions: Fall Precaution Comments: watch HR, O2 sats Restrictions Weight Bearing Restrictions: No Other Position/Activity Restrictions: Careful of having him sit in chair, lots of skin breakdown    Mobility  Bed Mobility Overal bed mobility: Needs Assistance Bed Mobility: Rolling;Sidelying to Sit Rolling: Supervision Sidelying to sit: Min guard;HOB elevated       General bed mobility comments: VC's for hand placement and technique using rails for support. Min guard for safety.  Transfers Overall transfer level: Needs assistance Equipment used: Rolling walker (2 wheeled) Transfers: Sit to/from Stand Sit to Stand: Min guard         General transfer comment: Min guard to stand from EOB x2,from BSC x1 and chair x1. VC's for hand placement as pt wanting to pull up on RW. + BM upon standing.   Ambulation/Gait Ambulation/Gait assistance: Min guard Ambulation Distance (Feet): 90 Feet (x2  bouts) Assistive device: Rolling walker (2 wheeled) Gait Pattern/deviations: Step-through pattern;Decreased stride length;Trunk flexed Gait velocity: decreased Gait velocity interpretation: <1.8 ft/sec, indicative of risk for recurrent falls General Gait Details: Pt continues to have slow gait speed, limited more by decreased endurance requiring seated rest break half way through ambulation bout. HRs ranged from 90s-120s today in A-fib. Sa02 remained >90s throughout on RA. Dyspnea present.    Stairs            Wheelchair Mobility    Modified Rankin (Stroke Patients Only)       Balance Overall balance assessment: Needs assistance Sitting-balance support: Feet supported;Single extremity supported Sitting balance-Leahy Scale: Good     Standing balance support: During functional activity;Bilateral upper extremity supported Standing balance-Leahy Scale: Poor Standing balance comment: Requires BUE support on RW for balance/safety. Able to stand for total A with peri care using UE support.                     Cognition Arousal/Alertness: Awake/alert Behavior During Therapy: WFL for tasks assessed/performed Overall Cognitive Status: Within Functional Limits for tasks assessed                      Exercises      General Comments General comments (skin integrity, edema, etc.): Continues to have thin and weeping skin LUE. Edema and fluid present in RUE dorsum of hand. Discussed getting donut pillow for rehab to allow for pt to sit for longer periods without further skin breakdown and sores.       Pertinent Vitals/Pain Pain Assessment: No/denies pain    Home Living  Prior Function            PT Goals (current goals can now be found in the care plan section) Progress towards PT goals: Progressing toward goals    Frequency  Min 3X/week    PT Plan Current plan remains appropriate    Co-evaluation             End of  Session Equipment Utilized During Treatment: Gait belt Activity Tolerance: Patient tolerated treatment well;Patient limited by fatigue Patient left: in chair;with call bell/phone within reach;with family/visitor present     Time: [id number] PT Time Calculation (min) (ACUTE ONLY): 41 min  Charges:  $Gait Training: 8-22 mins $Therapeutic Exercise: 8-22 mins $Therapeutic Activity: 8-22 mins                    G CodesAlvie Heidelberg:      Folan, Romana Deaton A 06/03/2014, 12:11 PM  Alvie HeidelbergShauna Folan, PT, DPT [phone]

## 2014-06-04 ENCOUNTER — Encounter: Payer: Self-pay | Admitting: Physician Assistant

## 2014-06-11 ENCOUNTER — Ambulatory Visit (INDEPENDENT_AMBULATORY_CARE_PROVIDER_SITE_OTHER): Payer: Medicare Other | Admitting: Physician Assistant

## 2014-06-11 ENCOUNTER — Encounter: Payer: Self-pay | Admitting: Physician Assistant

## 2014-06-11 VITALS — BP 124/72 | HR 96 | Ht 71.0 in | Wt 193.0 lb

## 2014-06-11 DIAGNOSIS — I10 Essential (primary) hypertension: Secondary | ICD-10-CM

## 2014-06-11 DIAGNOSIS — I5043 Acute on chronic combined systolic (congestive) and diastolic (congestive) heart failure: Secondary | ICD-10-CM

## 2014-06-11 DIAGNOSIS — N189 Chronic kidney disease, unspecified: Secondary | ICD-10-CM

## 2014-06-11 DIAGNOSIS — I429 Cardiomyopathy, unspecified: Secondary | ICD-10-CM

## 2014-06-11 DIAGNOSIS — I481 Persistent atrial fibrillation: Secondary | ICD-10-CM

## 2014-06-11 DIAGNOSIS — I4819 Other persistent atrial fibrillation: Secondary | ICD-10-CM

## 2014-06-11 MED ORDER — METOPROLOL TARTRATE 100 MG PO TABS: 100.0000 mg | ORAL_TABLET | Freq: Two times a day (BID) | ORAL | Status: AC

## 2014-06-11 NOTE — Progress Notes (Signed)
Cardiology Office Note   Date:  06/11/2014   ID:  Erik GoodellCharlie W Nouri, DOB 03/31/1932, MRN [id number]  PCP:  Josue HectorNYLAND,LEONARD ROBERT, MD  Cardiologist:  Dr. Rollene RotundaJames Hochrein (patient request - [place])    History of Present Illness: [name] is a 80 y.o. male with a hx of HTN, DM2, myasthenia gravis.  He was admitted 11/21-12/3 with perforated distal small bowel diverticulitis.  He underwent exploratory laparotomy with distal small bowel resection with side-to-side anastomosis by general surgery (Dr. Andrey CampanileWilson) on 05-22-14.  His post op course was c/b AFib with RVR.  He was rate controlled with CCB and beta blocker.  [facility]  He was diuresed for volume excess. Creatinine did rise but did improve prior to DC. He was followed by cardiology.  Echo demonstrated EF 45-50%.  this was similar to prior study in 2004.  He was DC to SNF.    [facility] He is here with his wife today. LE edema has increased since discharge. He notes dyspnea with moderate activities. He is NYHA 2b. He denies orthopnea or PND. He denies chest discomfort. He denies syncope or near syncope. He denies cough or wheezing. He does have occasional hemorrhoidal bleeding. He denies melena or hematochezia. He has seen the surgeon. He is recovering well. He does note some fatigue.  Studies:   - Echo (05/25/14):  Mild focal basal septal hypertrophy, EF 45-50%, apical anteroseptal and apical AK, Gr 1 DD, mild LAE  - Echo (2004):  EF 40-50%   Recent Labs: 06/03/2014: ALT 48; BUN 21; Creatinine 1.32; Hemoglobin 12.2*; Potassium 4.1; Sodium 136*  06/11/2014:  K+ 3.9, BUN 19, Creatinine 1.42, Hgb 13.2, PLT 217K  Lab Results  Component Value Date   INR 2.57* 06/03/2014   INR 2.71* 06/02/2014   INR 2.74* 06/01/2014     Recent Radiology:   Wt Readings from Last 3 Encounters:  06/11/14 193 lb (87.544 kg)  06/03/14 215 lb 13.3 oz (97.9 kg)  12/13/11 205 lb 12.8 oz (93.35 kg)       Past Medical History  Diagnosis Date  . Hypertension   . Myasthenia gravis   . Cellulitis   . Diverticula of small intestine     Current Outpatient Prescriptions  Medication Sig Dispense Refill  . alendronate (FOSAMAX) 70 MG tablet Take 70 mg by mouth every Sunday. Take with a full glass of water on an empty stomach.    . brimonidine-timolol (COMBIGAN) 0.2-0.5 % ophthalmic solution Place 1 drop into the left eye every 12 (twelve) hours.    . calcium carbonate (TUMS - DOSED IN MG ELEMENTAL CALCIUM) 500 MG chewable tablet Chew 1 tablet by mouth daily.    . cholecalciferol (VITAMIN D) 1000 UNITS tablet Take 1,000 Units by mouth daily.    Marland Kitchen. diltiazem (CARDIZEM CD) 180 MG 24 hr capsule Take 1 capsule (180 mg total) by mouth daily.    . diphenhydrAMINE (SOMINEX) 25 MG tablet Take 25 mg by mouth at bedtime as needed for sleep.    . furosemide (LASIX) 20 MG tablet Take 2 tablets (40 mg total) by mouth 2 (two) times daily. 30 tablet 0  . HYDROcodone-acetaminophen (NORCO/VICODIN) 5-325 MG per tablet Take 1-2 tablets by mouth every 4 (four) hours as needed for moderate pain or severe pain. 30 tablet 0  . metoprolol (LOPRESSOR) 50 MG tablet Take 1 tablet (50 mg total) by mouth 4 (four) times daily. (Patient taking differently: Take 50 mg  by mouth 3 (three) times daily. )    . Multiple Vitamins-Minerals (MULTIVITAMIN WITH MINERALS) tablet Take 1 tablet by mouth daily.    . Nutritional Supplements (PROMOD) LIQD Take 1 oz by mouth 2 (two) times daily. Wound healing    . potassium chloride SA (K-DUR,KLOR-CON) 20 MEQ tablet Take 1 tablet (20 mEq total) by mouth 3 (three) times daily.    . predniSONE (DELTASONE) 10 MG tablet Take 10 mg by mouth daily.    . vitamin C (ASCORBIC ACID) 500 MG tablet Take 500 mg by mouth daily.    Marland Kitchen. warfarin (COUMADIN) 2 MG tablet Take 1 tablet (2 mg total) by mouth daily. (Patient taking differently: Take 2 mg by mouth daily. Facility changed to 3mg )    . white petrolatum  (VASELINE) GEL Apply 1 application topically as needed (to where skin was removed).     No current facility-administered medications for this visit.     Allergies:   Review of patient's allergies indicates no known allergies.   Social History:  The patient  reports that he has quit smoking. He has never used smokeless tobacco. He reports that he does not drink alcohol or use illicit drugs.   Family History:  The patient's family history includes Heart failure in his father. There is no history of Heart attack or Stroke.    ROS:  Please see the history of present illness.      All other systems reviewed and negative.    PHYSICAL EXAM: VS:  BP 124/72 mmHg  Pulse 96  Ht 5\' 11"  (1.803 m)  Wt 193 lb (87.544 kg)  BMI 26.93 kg/m2 Well nourished, well developed, in no acute distress HEENT: normal Neck: no  JVDAt 90 Cardiac:  normal S1, S2; irregularly irregular rhythm; no murmur   Lungs:   clear to auscultation bilaterally, no wheezing, rhonchi or rales Abd: soft, nontender, no hepatomegaly Ext: 1-2+ bilateral ankle edema Back: 1+ presacral edema Skin: warm and dry Neuro:  CNs 2-12 intact, no focal abnormalities noted  EKG:  Atrial fibrillation, HR 96, normal axis, IVCD, no change from prior tracing      ASSESSMENT AND PLAN:  1.  Persistent Atrial Fibrillation:  CHADS2-VASc=3.   Continue Coumadin. He appears to be tolerating this. He did have a low platelet count in the hospital. This has returned to normal. He may be somewhat symptomatic from atrial fibrillation. We may need to consider trying to proceed with cardioversion to restore NSR.     -  I will ask the nursing home to check his INR weekly and he will be followed up here in the next 2-3 weeks.  We can make a decision regarding cardioversion at that time.    -  Continue current dose of diltiazem and metoprolol. I will change his metoprolol to 100 mg twice a day. 2.  Acute on Chronic Combined Systolic and Diastolic CHF:  He  does have evidence of continued volume excess.     -  I will adjust his Lasix to 60 mg twice a day [redacted] week along with potassium 40 mEq twice a day.     -  Check a BMET in 1 week.     -  Resume usual dose of Lasix and potassium after 7 days.    -  I will ask the nursing home to place lower extremity compression stockings to help with his edema. 3.  Cardiomyopathy:  Etiology not clear. He is currently rate controlled with diltiazem and  metoprolol. If he has evidence of worsening LV function over time, we will need to stop his calcium channel blocker. He is currently not on ACEI as he does have CKD. 4.  Perforated Small Bowel s/p Resection:  Follow-up with surgery as planned. 5.  Hypertension:  Controlled. 6.  Chronic Kidney Disease:  Monitor renal function and potassium closely with adjustments in diuretics.  Disposition:   FU with Dr. Antoine Poche in [place] in 2-3 weeks. If no appointment available, he will follow-up with me here in [place].   Signed, Brynda Rim, MHS 06/11/2014 12:27 PM    [facility] [address] [place], [place], [place]  [address] Phone: [phone]; Fax: [phone]

## 2014-06-11 NOTE — Patient Instructions (Addendum)
1. INCREASE LASIX TO 60 MG TWICE DAILY FOR 1 WEEK; AFTER 1 WEEK THEN RESUME LASIX 40 MG TWICE DAILY  2. INCREASE POTASSIUM TO 40 MEQ TWICE DAILY FOR 1 WEEK; AFTER 1 WEEK THEN RESUME POTASSIUM TO 20 MEQ 3 TIMES DAILY  3. INCREASE METOPROLOL TO 100 MG TWICE DAILY  LAB [facility] WITH THE RESULTS TO BE FA[place]ED TO [place] [phone]  NEED NURSING FACILITY TO CHECK INR WEEKLY AND FAX RESULTS TO [place], [place] [phone]  Zenon MayoFOLLOW UP [facility], [facility] 07/07/14 @ 2 PM

## 2014-06-14 ENCOUNTER — Telehealth: Payer: Self-pay | Admitting: Physician Assistant

## 2014-06-14 NOTE — Telephone Encounter (Signed)
New msg     Brandy of [facility]. calling, [name] has req. PT/INR to be completed weekly with results faxed to office.  [facility] monitors all coumadin and dosages.  Does [name] still need results every week and does he want to dose coumadin himself?    Please call Brandy at [phone] ext [phone].

## 2014-06-14 NOTE — Telephone Encounter (Signed)
I cb and s/w [name]. Erik BienenstockBrandy was confirming PT/INR weekly. I confirmed since PA looking at pending DCCV. INR today was 3.0 which Erik BienenstockBrandy will fax over today.  Erik BienenstockBrandy said she has to have PCP order INR's weekly.

## 2014-06-18 ENCOUNTER — Encounter: Payer: Medicare Other | Admitting: Physician Assistant

## 2014-06-21 ENCOUNTER — Encounter: Payer: Self-pay | Admitting: Physician Assistant

## 2014-06-21 LAB — PROTIME-INR: INR: 3.9 — AB (ref 0.9–1.1)

## 2014-06-28 NOTE — Progress Notes (Signed)
Labs 06/18/14: Creatinine 1.23, potassium 4.4  INRs from the nursing home: 06/14/14: 3.0. 06/21/14: 3.9

## 2014-07-01 ENCOUNTER — Encounter: Payer: Self-pay | Admitting: Physician Assistant

## 2014-07-07 ENCOUNTER — Ambulatory Visit (INDEPENDENT_AMBULATORY_CARE_PROVIDER_SITE_OTHER): Payer: Medicare Other | Admitting: Physician Assistant

## 2014-07-07 ENCOUNTER — Encounter: Payer: Self-pay | Admitting: *Deleted

## 2014-07-07 ENCOUNTER — Encounter: Payer: Self-pay | Admitting: Physician Assistant

## 2014-07-07 VITALS — BP 118/64 | HR 67 | Ht 71.0 in | Wt 181.4 lb

## 2014-07-07 DIAGNOSIS — N189 Chronic kidney disease, unspecified: Secondary | ICD-10-CM

## 2014-07-07 DIAGNOSIS — I429 Cardiomyopathy, unspecified: Secondary | ICD-10-CM

## 2014-07-07 DIAGNOSIS — I481 Persistent atrial fibrillation: Secondary | ICD-10-CM

## 2014-07-07 DIAGNOSIS — I4819 Other persistent atrial fibrillation: Secondary | ICD-10-CM

## 2014-07-07 DIAGNOSIS — I10 Essential (primary) hypertension: Secondary | ICD-10-CM

## 2014-07-07 DIAGNOSIS — I5042 Chronic combined systolic (congestive) and diastolic (congestive) heart failure: Secondary | ICD-10-CM

## 2014-07-07 LAB — BASIC METABOLIC PANEL
BUN: 15 mg/dL (ref 6–23)
CHLORIDE: 100 meq/L (ref 96–112)
CO2: 28 mEq/L (ref 19–32)
CREATININE: 1.3 mg/dL (ref 0.4–1.5)
Calcium: 9.5 mg/dL (ref 8.4–10.5)
GFR: 57.89 mL/min — ABNORMAL LOW (ref >60.00)
Glucose, Bld: 184 mg/dL — ABNORMAL HIGH (ref 70–99)
POTASSIUM: 4.5 meq/L (ref 3.5–5.1)
Sodium: 134 mEq/L — ABNORMAL LOW (ref 135–145)

## 2014-07-07 LAB — PROTIME-INR
INR: 2.7 ratio — ABNORMAL HIGH (ref 0.8–1.0)
Prothrombin Time: 29.6 s — ABNORMAL HIGH (ref 9.6–13.1)

## 2014-07-07 MED ORDER — FUROSEMIDE 20 MG PO TABS: 20.0000 mg | ORAL_TABLET | ORAL | Status: DC | PRN

## 2014-07-07 NOTE — Patient Instructions (Addendum)
LAB WORK TODAY; BMET, PT/INR  Your physician has recommended that you have a Cardioversion (DCCV). YOU HAVE BEEN SCHEDULED FOR 07/13/14 @ 9 AM Electrical Cardioversion uses a jolt of electricity to your heart either through paddles or wired patches attached to your chest. This is a controlled, usually prescheduled, procedure. Defibrillation is done under light anesthesia in the hospital, and you usually go home the day of the procedure. This is done to get your heart back into a normal rhythm. You are not awake for the procedure. Please see the instruction sheet given to you today.  Your physician recommends that you schedule a follow-up appointment in: 2 WEEKS WITH DR. HOCHREIN IN THE [place] OFFICE OR SCOTT WEAVER, PAC IN THE [address] OFFICE.   WEIGH DAILY AND TAKE LASIX IF YOUR WEIGHT INCREASES BY 3 LB'S IN 1 DAY.

## 2014-07-07 NOTE — H&P (Signed)
History and Physical   Date:  07/07/2014   ID:  [name], DOB 25-Mar-1932, MRN [id number]  PCP:  Josue Hector, MD  Cardiologist:  Dr. Rollene Rotunda (patient request - [place])    History of Present Illness: [name] is a 81 y.o. male with a hx of HTN, DM2, myasthenia gravis.  He was admitted 11/21-12/3 with perforated distal small bowel diverticulitis.  He underwent exploratory laparotomy with distal small bowel resection with side-to-side anastomosis by general surgery (Dr. Andrey Campanile) on 05-22-14.  His post op course was c/b AFib with RVR.  He was rate controlled with CCB and beta blocker.  Coumadin was started for [facility].  He was diuresed for volume excess. Creatinine did rise but did improve prior to DC. He was followed by cardiology.  Echo demonstrated EF 45-50%.  This was similar to prior study in 2004.  He was DC to SNF.    I saw him in FU 06/10/14.  He was staying at [facility] [place] [facility].  He demonstrated evidence of volume excess.  I adjusted his Lasix.  He remained in AFib.  I asked that his nursing home check his INR weekly and he returns for FU with an eye towards DCCV.    He is now home.  He is feeling better. His PCP took him off of Lasix.  He stopped his K+ as well.  Edema has improved.  He denies chest pain or significant dyspnea.  He denies orthopnea, PND.  He remains fatigued and has no appetite.    Studies:   - Echo (05/25/14):  Mild focal basal septal hypertrophy, EF 45-50%, apical anteroseptal and apical AK, Gr 1 DD, mild LAE  - Echo (2004):  EF 40-50%   Recent Labs: 06/03/2014: ALT 48; BUN 21; Creatinine 1.32; Hemoglobin 12.2*; Potassium 4.1; Sodium 136*  06/11/2014:  K+ 3.9, BUN 19, Creatinine 1.42, Hgb 13.2, PLT 217K 06/18/14:  K 4.4, Creatinine 1.23, BNP 493   Lab Results  Component Value Date   INR 3.9* 06/21/2014   INR 2.57* 06/03/2014   INR 2.71* 06/02/2014  INR   3.0    06/14/14 INR    3.9    06/21/14   Wt Readings from Last 3  Encounters:  07/07/14 181 lb 6.4 oz (82.283 kg)  06/11/14 193 lb (87.544 kg)  06/03/14 215 lb 13.3 oz (97.9 kg)     Past Medical History  Diagnosis Date  . Hypertension   . Myasthenia gravis   . Cellulitis   . Diverticula of small intestine     Current Outpatient Prescriptions  Medication Sig Dispense Refill  . alendronate (FOSAMAX) 70 MG tablet Take 70 mg by mouth every Sunday. Take with a full glass of water on an empty stomach.    . brimonidine-timolol (COMBIGAN) 0.2-0.5 % ophthalmic solution Place 1 drop into the left eye every 12 (twelve) hours.    . calcium carbonate (TUMS - DOSED IN MG ELEMENTAL CALCIUM) 500 MG chewable tablet Chew 1 tablet by mouth daily.    . cholecalciferol (VITAMIN D) 1000 UNITS tablet Take 1,000 Units by mouth daily.    Marland Kitchen diltiazem (CARDIZEM CD) 180 MG 24 hr capsule Take 1 capsule (180 mg total) by mouth daily.    . metoprolol (LOPRESSOR) 100 MG tablet Take 1 tablet (100 mg total) by mouth 2 (two) times daily.    . Multiple Vitamins-Minerals (MULTIVITAMIN WITH MINERALS) tablet Take 1 tablet by mouth daily.    . Nutritional Supplements (PROMOD) LIQD Take 1  oz by mouth 2 (two) times daily. Wound healing    . vitamin C (ASCORBIC ACID) 500 MG tablet Take 500 mg by mouth daily.    Marland Kitchen. warfarin (COUMADIN) 2 MG tablet Take 1 tablet (2 mg total) by mouth daily. (Patient taking differently: Take 2 mg by mouth daily. Facility changed to 3mg )     No current facility-administered medications for this visit.     Allergies:   Review of patient's allergies indicates no known allergies.   Social History:  The patient  reports that he has quit smoking. He has never used smokeless tobacco. He reports that he does not drink alcohol or use illicit drugs.   Family History:  The patient's family history includes Heart failure in his father. There is no history of Heart attack or Stroke.    ROS:  Please see the history of present illness.       All other systems reviewed and  negative.    PHYSICAL EXAM: VS:  BP 118/64 mmHg  Pulse 67  Ht 5\' 11"  (1.803 m)  Wt 181 lb 6.4 oz (82.283 kg)  BMI 25.31 kg/m2 Well nourished, well developed, in no acute distress HEENT: normal Neck: no  JVD  Cardiac:  normal S1, S2; irregularly irregular rhythm; no murmur   Lungs:   clear to auscultation bilaterally, no wheezing, rhonchi or rales Abd: soft, nontender, no hepatomegaly Ext: trace bilateral ankle edema Skin: warm and dry Neuro:  CNs 2-12 intact, no focal abnormalities noted  EKG:  AFib, HR 67, no change from prior tracing  ASSESSMENT AND PLAN:  1.  Persistent Atrial Fibrillation:  CHADS2-VASc=3.  He will need to continue Coumadin.  He remains in AFib.  He is likely symptomatic from this.  We contacted his home health agency. His last INR 12/28 was reportedly 3. It is certainly worth trying DCCV to restore NSR. Even if he does not maintain sinus rhythm but has significant symptomatic improvement, we will know that pursuing a strategy of rhythm control is most appropriate. Otherwise, we can certainly continue a strategy of rate control.    -  Risks and benefits of cardioversion were discussed with the patient's wife and he agrees to proceed.    -  Obtain INR today. We'll plan on repeating INR 1 day prior to procedure. 2.  Chronic Combined Systolic and Diastolic CHF:  Volume stable. He is currently off of diuretics.    -  Check BMET. 3.  Cardiomyopathy:  Etiology not clear. He is currently rate controlled with diltiazem and metoprolol. If he has evidence of worsening LV function over time, we will need to stop his calcium channel blocker. He is currently not on ACEI as he does have CKD.   4.  Perforated Small Bowel s/p Resection:  Follow-up with surgery as planned. 5.  Hypertension:   Controlled. 6.  Chronic Kidney Disease:  Recent Creatinine stable.  Repeat BMET.   Disposition:   FU with Dr. Antoine [place]Hochrein in [place] in 2 [place] or [place] if no appointments are  available.   Signed, Brynda RimScott Weaver, PA-C, MHS 07/07/2014 2:46 PM    [facility] [address], [place], [place]  [address] Phone: [phone]; Fax: [phone]

## 2014-07-07 NOTE — Progress Notes (Signed)
Cardiology Office Note   Date:  07/07/2014   ID:  [name], DOB Oct 07, 1931, MRN [id number]  PCP:  Erik Hector, MD  Cardiologist:  Dr. Rollene [name] (patient request - [place])    History of Present Illness: [name] is a 81 y.o. male with a hx of HTN, DM2, myasthenia gravis.  He was admitted 11/21-12/3 with perforated distal small bowel diverticulitis.  He underwent exploratory laparotomy with distal small bowel resection with side-to-side anastomosis by general surgery (Dr. Andrey [name]) on 05-22-14.  His post op course was c/b AFib with RVR.  He was rate controlled with CCB and beta blocker.  Coumadin was started for [facility].  He was diuresed for volume excess. Creatinine did rise but did improve prior to DC. He was followed by cardiology.  Echo demonstrated EF 45-50%.  This was similar to prior study in 2004.  He was DC to SNF.    I saw him in FU 06/10/14.  He was staying at [facility].  He demonstrated evidence of volume excess.  I adjusted his Lasix.  He remained in AFib.  I asked that his nursing home check his INR weekly and he returns for FU with an eye towards DCCV.    He is now home.  He is feeling better. His PCP took him off of Lasix.  He stopped his K+ as well.  Edema has improved.  He denies chest pain or significant dyspnea.  He denies orthopnea, PND.  He remains fatigued and has no appetite.    Studies:   - Echo (05/25/14):  Mild focal basal septal hypertrophy, EF 45-50%, apical anteroseptal and apical AK, Gr 1 DD, mild LAE  - Echo (2004):  EF 40-50%   Recent Labs: 06/03/2014: ALT 48; BUN 21; Creatinine 1.32; Hemoglobin 12.2*; Potassium 4.1; Sodium 136*  06/11/2014:  K+ 3.9, BUN 19, Creatinine 1.42, Hgb 13.2, PLT 217K 06/18/14:  K 4.4, Creatinine 1.23, BNP 493   Lab Results  Component Value Date   INR 3.9* 06/21/2014   INR 2.57* 06/03/2014   INR 2.71* 06/02/2014  INR   3.0    06/14/14 INR    3.9    06/21/14   Wt Readings from Last  3 Encounters:  07/07/14 181 lb 6.4 oz (82.283 kg)  06/11/14 193 lb (87.544 kg)  06/03/14 215 lb 13.3 oz (97.9 kg)     Past Medical History  Diagnosis Date  . Hypertension   . Myasthenia gravis   . Cellulitis   . Diverticula of small intestine     Current Outpatient Prescriptions  Medication Sig Dispense Refill  . alendronate (FOSAMAX) 70 MG tablet Take 70 mg by mouth every Sunday. Take with a full glass of water on an empty stomach.    . brimonidine-timolol (COMBIGAN) 0.2-0.5 % ophthalmic solution Place 1 drop into the left eye every 12 (twelve) hours.    . calcium carbonate (TUMS - DOSED IN MG ELEMENTAL CALCIUM) 500 MG chewable tablet Chew 1 tablet by mouth daily.    . cholecalciferol (VITAMIN D) 1000 UNITS tablet Take 1,000 Units by mouth daily.    Marland Kitchen diltiazem (CARDIZEM CD) 180 MG 24 hr capsule Take 1 capsule (180 mg total) by mouth daily.    . metoprolol (LOPRESSOR) 100 MG tablet Take 1 tablet (100 mg total) by mouth 2 (two) times daily.    . Multiple Vitamins-Minerals (MULTIVITAMIN WITH MINERALS) tablet Take 1 tablet by mouth daily.    . Nutritional Supplements (PROMOD) LIQD Take 1  oz by mouth 2 (two) times daily. Wound healing    . vitamin C (ASCORBIC ACID) 500 MG tablet Take 500 mg by mouth daily.    Marland Kitchen. warfarin (COUMADIN) 2 MG tablet Take 1 tablet (2 mg total) by mouth daily. (Patient taking differently: Take 2 mg by mouth daily. Facility changed to 3mg )     No current facility-administered medications for this visit.     Allergies:   Review of patient's allergies indicates no known allergies.   Social History:  The patient  reports that he has quit smoking. He has never used smokeless tobacco. He reports that he does not drink alcohol or use illicit drugs.   Family History:  The patient's family history includes Heart failure in his father. There is no history of Heart attack or Stroke.    ROS:  Please see the history of present illness.       All other systems reviewed  and negative.    PHYSICAL EXAM: VS:  BP 118/64 mmHg  Pulse 67  Ht 5\' 11"  (1.803 m)  Wt 181 lb 6.4 oz (82.283 kg)  BMI 25.31 kg/m2 Well nourished, well developed, in no acute distress HEENT: normal Neck: no  JVD  Cardiac:  normal S1, S2; irregularly irregular rhythm; no murmur   Lungs:   clear to auscultation bilaterally, no wheezing, rhonchi or rales Abd: soft, nontender, no hepatomegaly Ext: trace bilateral ankle edema Skin: warm and dry Neuro:  CNs 2-12 intact, no focal abnormalities noted  EKG:  AFib, HR 67, no change from prior tracing  ASSESSMENT AND PLAN:  1.  Persistent Atrial Fibrillation:  CHADS2-VASc=3.  He will need to continue Coumadin.  He remains in AFib.  He is likely symptomatic from this.  We contacted his home health agency. His last INR 12/28 was reportedly 3. It is certainly worth trying DCCV to restore NSR. Even if he does not maintain sinus rhythm but has significant symptomatic improvement, we will know that pursuing a strategy of rhythm control is most appropriate. Otherwise, we can certainly continue a strategy of rate control.    -  Risks and benefits of cardioversion were discussed with the patient's wife and he agrees to proceed.    -  Obtain INR today. We'll plan on repeating INR 1 day prior to procedure.    -  Hold diltiazem a.m. of procedure. Consider discontinuing this if heart rate remains slow in sinus rhythm. 2.  Chronic Combined Systolic and Diastolic CHF:  Volume stable. He is currently off of diuretics.    -  Check BMET. 3.  Cardiomyopathy:  Etiology not clear. He is currently rate controlled with diltiazem and metoprolol. If he has evidence of worsening LV function over time, we will need to stop his calcium channel blocker. He is currently not on ACEI as he does have CKD.   4.  Perforated Small Bowel s/p Resection:  Follow-up with surgery as planned. 5.  Hypertension:   Controlled. 6.  Chronic Kidney Disease:  Recent Creatinine stable.  Repeat  BMET.   Disposition:   [place] Antoine [name] or [place] if no appointments are available.   Signed, Brynda RimScott Larwence Tu, PA-C, MHS 07/07/2014 2:46 PM    [facility] [address], [place], [place]  [address] Phone: [phone]; Fax: [phone]

## 2014-07-09 ENCOUNTER — Telehealth: Payer: Self-pay | Admitting: Physician Assistant

## 2014-07-09 ENCOUNTER — Telehealth: Payer: Self-pay | Admitting: *Deleted

## 2014-07-09 MED ORDER — POTASSIUM CHLORIDE CRYS ER 20 MEQ PO TBCR: 20.0000 meq | EXTENDED_RELEASE_TABLET | ORAL | Status: AC | PRN

## 2014-07-09 NOTE — Telephone Encounter (Signed)
s/w HHRN Molli HazardMatthew ok for pt to take K+ 20 meq only on the days when pt takes PRN lasix. He states he will advise pt and his wife.

## 2014-07-09 NOTE — Telephone Encounter (Signed)
I s/w HHRN Molli HazardMatthew who has been advised ok for pt to take lasix 20 mg daily PRN and will take K+ 20 meq PRN only when he takes the lasix. Molli HazardMatthew, [facility] read back instructions with verbal understanding to Plan of Care and said he will pt know.

## 2014-07-09 NOTE — Telephone Encounter (Signed)
s/w pt's wife in regards to lab work. She asked  if pt does have to take lasix then should he take the K+ and if so what dose. I advised will d/w Bing NeighborsScott W. PA since he did not say in his note to take any K+ w/PRN lasix, I w/cb later today. wife said ok.

## 2014-07-09 NOTE — Telephone Encounter (Signed)
New Msg      Matthew, [facility] from [facility] calling, pt has concerns about  Potassium intake.  Pt hasn't been on Lasix since 12/30.   If Lasix is resumed should potassium be resumed? Please call at [phone]. Leave msg if necessary.

## 2014-07-12 ENCOUNTER — Telehealth: Payer: Self-pay

## 2014-07-12 NOTE — Telephone Encounter (Signed)
Leigh from Dr Joyce CopaNyland's [place] called INR results 2.3 today 07/12/14.  Pt is pending DCCV with Dr Jens Somrenshaw tomorrow 07/13/14.  Called Deliah Goodyebra Mathis, RN [facility] office and advised of results.  Will forward results to her and Dr Jens Somrenshaw.

## 2014-07-12 NOTE — Telephone Encounter (Signed)
Will make dr crenshaw aware 

## 2014-07-13 ENCOUNTER — Encounter (HOSPITAL_COMMUNITY): Payer: Self-pay | Admitting: *Deleted

## 2014-07-13 ENCOUNTER — Ambulatory Visit (HOSPITAL_COMMUNITY): Payer: Medicare Other | Admitting: Certified Registered"

## 2014-07-13 ENCOUNTER — Encounter (HOSPITAL_COMMUNITY)
Admission: RE | Disposition: A | Discharge status: Home / Self Care | Payer: Self-pay | Source: Ambulatory Visit | Attending: Cardiovascular Disease

## 2014-07-13 ENCOUNTER — Ambulatory Visit (HOSPITAL_COMMUNITY)
Admission: RE | Admit: 2014-07-13 | Discharge: 2014-07-13 | Disposition: A | Discharge status: Home / Self Care | Payer: Medicare Other | Source: Ambulatory Visit | Attending: Cardiovascular Disease | Admitting: Cardiovascular Disease

## 2014-07-13 DIAGNOSIS — Z7901 Long term (current) use of anticoagulants: Secondary | ICD-10-CM | POA: Diagnosis not present

## 2014-07-13 DIAGNOSIS — E119 Type 2 diabetes mellitus without complications: Secondary | ICD-10-CM | POA: Diagnosis not present

## 2014-07-13 DIAGNOSIS — I429 Cardiomyopathy, unspecified: Secondary | ICD-10-CM | POA: Diagnosis not present

## 2014-07-13 DIAGNOSIS — I5042 Chronic combined systolic (congestive) and diastolic (congestive) heart failure: Secondary | ICD-10-CM | POA: Insufficient documentation

## 2014-07-13 DIAGNOSIS — I4819 Other persistent atrial fibrillation: Secondary | ICD-10-CM

## 2014-07-13 DIAGNOSIS — Z87891 Personal history of nicotine dependence: Secondary | ICD-10-CM | POA: Diagnosis not present

## 2014-07-13 DIAGNOSIS — I4891 Unspecified atrial fibrillation: Secondary | ICD-10-CM | POA: Insufficient documentation

## 2014-07-13 DIAGNOSIS — N189 Chronic kidney disease, unspecified: Secondary | ICD-10-CM | POA: Diagnosis not present

## 2014-07-13 DIAGNOSIS — I129 Hypertensive chronic kidney disease with stage 1 through stage 4 chronic kidney disease, or unspecified chronic kidney disease: Secondary | ICD-10-CM | POA: Diagnosis not present

## 2014-07-13 DIAGNOSIS — Z79899 Other long term (current) drug therapy: Secondary | ICD-10-CM | POA: Insufficient documentation

## 2014-07-13 DIAGNOSIS — I480 Paroxysmal atrial fibrillation: Secondary | ICD-10-CM

## 2014-07-13 HISTORY — PX: CARDIOVERSION: SHX1299

## 2014-07-13 SURGERY — CARDIOVERSION
Anesthesia: Monitor Anesthesia Care

## 2014-07-13 MED ORDER — SODIUM CHLORIDE 0.9 % IV SOLN
250.0000 mL | INTRAVENOUS | Status: DC
  Administered 2014-07-13: 500 mL via INTRAVENOUS

## 2014-07-13 MED ORDER — FENTANYL CITRATE 0.05 MG/ML IJ SOLN: 25.0000 ug | INTRAMUSCULAR | Status: DC | PRN

## 2014-07-13 MED ORDER — PROMETHAZINE HCL 25 MG/ML IJ SOLN: 6.2500 mg | INTRAMUSCULAR | Status: DC | PRN

## 2014-07-13 MED ORDER — LIDOCAINE HCL (CARDIAC) 20 MG/ML IV SOLN
INTRAVENOUS | Status: DC | PRN
  Administered 2014-07-13: 40 mg via INTRAVENOUS

## 2014-07-13 MED ORDER — SODIUM CHLORIDE 0.9 % IJ SOLN: 3.0000 mL | Freq: Two times a day (BID) | INTRAMUSCULAR | Status: DC

## 2014-07-13 MED ORDER — MEPERIDINE HCL 100 MG/ML IJ SOLN: 6.2500 mg | INTRAMUSCULAR | Status: DC | PRN

## 2014-07-13 MED ORDER — PROPOFOL 10 MG/ML IV BOLUS
INTRAVENOUS | Status: DC | PRN
  Administered 2014-07-13: 45 mg via INTRAVENOUS

## 2014-07-13 MED ORDER — SODIUM CHLORIDE 0.9 % IJ SOLN: 3.0000 mL | INTRAMUSCULAR | Status: DC | PRN

## 2014-07-13 NOTE — Anesthesia Postprocedure Evaluation (Signed)
  Anesthesia Post-op Note  Patient: Erik MilesCharlie W Eland  Procedure(s) Performed: Procedure(s): CARDIOVERSION (N/A)  Patient Location: PACU  Anesthesia Type:MAC  Level of Consciousness: awake  Airway and Oxygen Therapy: Patient Spontanous Breathing and Patient connected to nasal cannula oxygen  Post-op Pain: none  Post-op Assessment: Post-op Vital signs reviewed  Post-op Vital Signs: Reviewed and stable  Last Vitals:  Filed Vitals:   07/13/14 0827  BP: 175/90  Temp: 36.6 C  Resp: 19    Complications: No apparent anesthesia complications

## 2014-07-13 NOTE — Transfer of Care (Signed)
Immediate Anesthesia Transfer of Care Note  Patient: [name]  Procedure(s) Performed: Procedure(s): CARDIOVERSION (N/A)  Patient Location: Endoscopy Unit  Anesthesia Type:General  Level of Consciousness: awake, alert  and sedated  Airway & Oxygen Therapy: Patient Spontanous Breathing and Patient connected to nasal cannula oxygen  Post-op Assessment: Post -op Vital signs reviewed and stable  Post vital signs: stable  Complications: No apparent anesthesia complications

## 2014-07-13 NOTE — Procedures (Signed)
Electrical Cardioversion Procedure Note Lorrin GoodellCharlie W Benko [id number] 03/24/1932  Procedure: Electrical Cardioversion Indications:  Atrial Fibrillation  Procedure Details Consent: Risks of procedure as well as the alternatives and risks of each were explained to the (patient/caregiver).  Consent for procedure obtained. Time Out: Verified patient identification, verified procedure, site/side was marked, verified correct patient position, special equipment/implants available, medications/allergies/relevent history reviewed, required imaging and test results available.  Performed  Patient placed on cardiac monitor, pulse oximetry, supplemental oxygen as necessary.  Sedation given: Patient sedated by anesthesia with lidocaine 40 mg and diprovan 45 mg IV. Pacer pads placed anterior and posterior chest.  Cardioverted 1 time(s).  Cardioverted at 120J.  Evaluation Findings: Post procedure EKG shows: NSR Complications: None Patient did tolerate procedure well.   Olga MillersBrian Crenshaw 07/13/2014, 8:18 AM

## 2014-07-13 NOTE — Anesthesia Preprocedure Evaluation (Addendum)
Anesthesia Evaluation  Patient identified by MRN, date of birth, ID band  Reviewed: Allergy & Precautions, NPO status , Patient's Chart, lab work & pertinent test results, reviewed documented beta blocker date and time   Airway        Dental   Pulmonary former smoker,          Cardiovascular hypertension, Pt. on medications  ECHO 05/2014 EF 45%   Neuro/Psych    GI/Hepatic negative GI ROS, Neg liver ROS,   Endo/Other  diabetes (suspect he has by Glucose results)  Renal/GU Renal InsufficiencyRenal diseaseGFR 48     Musculoskeletal   Abdominal   Peds  Hematology   Anesthesia Other Findings   Reproductive/Obstetrics                            Anesthesia Physical Anesthesia Plan  ASA: III  Anesthesia Plan: MAC   Post-op Pain Management:    Induction: Intravenous  Airway Management Planned: Nasal Cannula  Additional Equipment:   Intra-op Plan:   Post-operative Plan:   Informed Consent: I have reviewed the patients History and Physical, chart, labs and discussed the procedure including the risks, benefits and alternatives for the proposed anesthesia with the patient or authorized representative who has indicated his/her understanding and acceptance.     Plan Discussed with:   Anesthesia Plan Comments:         Anesthesia Quick Evaluation

## 2014-07-13 NOTE — Discharge Instructions (Signed)
Electrical Cardioversion °Electrical cardioversion is the delivery of a jolt of electricity to change the rhythm of the heart. Sticky patches or metal paddles are placed on the chest to deliver the electricity from a device. This is done to restore a normal rhythm. A rhythm that is too fast or not regular keeps the heart from pumping well. °Electrical cardioversion is done in an emergency if:  °· There is low or no blood pressure as a result of the heart rhythm.   °· Normal rhythm must be restored as fast as possible to protect the brain and heart from further damage.   °· It may save a life. °Cardioversion may be done for heart rhythms that are not immediately life threatening, such as atrial fibrillation or flutter, in which:  °· The heart is beating too fast or is not regular.   °· Medicine to change the rhythm has not worked.   °· It is safe to wait in order to allow time for preparation. °· Symptoms of the abnormal rhythm are bothersome. °· The risk of stroke and other serious problems can be reduced. °LET YOUR HEALTH CARE PROVIDER KNOW ABOUT:  °· Any allergies you have. °· All medicines you are taking, including vitamins, herbs, eye drops, creams, and over-the-counter medicines. °· Previous problems you or members of your family have had with the use of anesthetics.   °· Any blood disorders you have.   °· Previous surgeries you have had.   °· Medical conditions you have. °RISKS AND COMPLICATIONS  °Generally, this is a safe procedure. However, problems can occur and include:  °· Breathing problems related to the anesthetic used. °· A blood clot that breaks free and travels to other parts of your body. This could cause a stroke or other problems. The risk of this is lowered by use of blood-thinning medicine (anticoagulant) prior to the procedure. °· Cardiac arrest (rare). °BEFORE THE PROCEDURE  °· You may have tests to detect blood clots in your heart and to evaluate heart function.  °· You may start taking  anticoagulants so your blood does not clot as easily.   °· Medicines may be given to help stabilize your heart rate and rhythm. °PROCEDURE °· You will be given medicine through an IV tube to reduce discomfort and make you sleepy (sedative).   °· An electrical shock will be delivered. °AFTER THE PROCEDURE °Your heart rhythm will be watched to make sure it does not change.  °Document Released: 06/08/2002 Document Revised: 11/02/2013 Document Reviewed: 12/31/2012 °ExitCare® Patient Information ©2015 [employer]. This information is not intended to replace advice given to you by your health care provider. Make sure you discuss any questions you have with your health care provider. °Monitored Anesthesia Care °Monitored anesthesia care is an anesthesia service for a medical procedure. Anesthesia is the loss of the ability to feel pain. It is produced by medicines called anesthetics. It may affect a small area of your body (local anesthesia), a large area of your body (regional anesthesia), or your entire body (general anesthesia). The need for monitored anesthesia care depends your procedure, your condition, and the potential need for regional or general anesthesia. It is often provided during procedures where:  °· General anesthesia may be needed if there are complications. This is because you need special care when you are under general anesthesia.   °· You will be under local or regional anesthesia. This is so that you are able to have higher levels of anesthesia if needed.   °· You will receive calming medicines (sedatives). This is especially   the case if sedatives are given to put you in a semi-conscious state of relaxation (deep sedation). This is because the amount of sedative needed to produce this state can be hard to predict. Too much of a sedative can produce general anesthesia. °Monitored anesthesia care is performed by one or more health care providers who have special training in all types of anesthesia.  You will need to meet with these health care providers before your procedure. During this meeting, they will ask you about your medical history. They will also give you instructions to follow. (For example, you will need to stop eating and drinking before your procedure. You may also need to stop or change medicines you are taking.) During your procedure, your health care providers will stay with you. They will:  °· Watch your condition. This includes watching your blood pressure, breathing, and level of pain.   °· Diagnose and treat problems that occur.   °· Give medicines if they are needed. These may include calming medicines (sedatives) and anesthetics.   °· Make sure you are comfortable.   °Having monitored anesthesia care does not necessarily mean that you will be under anesthesia. It does mean that your health care providers will be able to manage anesthesia if you need it or if it occurs. It also means that you will be able to have a different type of anesthesia than you are having if you need it. When your procedure is complete, your health care providers will continue to watch your condition. They will make sure any medicines wear off before you are allowed to go home.  °Document Released: 03/14/2005 Document Revised: 11/02/2013 Document Reviewed: 07/30/2012 °ExitCare® Patient Information ©2015 [employer]. This information is not intended to replace advice given to you by your health care provider. Make sure you discuss any questions you have with your health care provider. ° °

## 2014-07-13 NOTE — H&P (Signed)
[facility]  07/07/2014 2:00 PM  Office Visit  MRN:  [id number]   Description: Male DOB: 18-Apr-1932  Provider: Beatrice Lecher, PA-C  Department: [facility] [facility]       Vital Signs  Most recent update: 07/07/2014 2:43 PM by Babette Relic T Wysor    BP Pulse Ht Wt BMI    118/64 mmHg 67  (1.803 m) 181 lb 6.4 oz (82.283 kg) 25.31 kg/m2      Progress Notes      Beatrice Lecher, PA-C at 07/07/2014 1:57 PM     Status: Signed       Expand All Collapse All      Cardiology Office Note   Date: 07/07/2014   ID: CAVION FAIOLA, DOB 20-Oct-1931, MRN [id number]  PCP: Josue Hector, MD Cardiologist: Dr. Rollene Rotunda (patient request - [place])  History of Present Illness: MOKSH LOOMER is a 81 y.o. male with a hx of HTN, DM2, myasthenia gravis. He was admitted 11/21-12/3 with perforated distal small bowel diverticulitis. He underwent exploratory laparotomy with distal small bowel resection with side-to-side anastomosis by general surgery (Dr. Andrey Campanile) on 05-22-14. His post op course was c/b AFib with RVR. He was rate controlled with CCB and beta blocker. Coumadin was started for [facility]. He was diuresed for volume excess. Creatinine did rise but did improve prior to DC. He was followed by cardiology. Echo demonstrated EF 45-50%. This was similar to prior study in 2004. He was DC to SNF.   I saw him in FU 06/10/14. He was staying at [facility]. He demonstrated evidence of volume excess. I adjusted his Lasix. He remained in AFib. I asked that his nursing home check his INR weekly and he returns for FU with an eye towards DCCV.   He is now home. He is feeling better. His PCP took him off of Lasix. He stopped his K+ as well. Edema has improved. He denies chest pain or significant dyspnea. He denies orthopnea, PND. He remains fatigued and has no appetite.    Studies:  - Echo (05/25/14): Mild focal basal septal hypertrophy, EF 45-50%,  apical anteroseptal and apical AK, Gr 1 DD, mild LAE - Echo (2004): EF 40-50%   Recent Labs: 06/03/2014: ALT 48; BUN 21; Creatinine 1.32; Hemoglobin 12.2*; Potassium 4.1; Sodium 136*  06/11/2014: K+ 3.9, BUN 19, Creatinine 1.42, Hgb 13.2, PLT 217K 06/18/14: K 4.4, Creatinine 1.23, BNP 493    Recent Labs    Lab Results  Component Value Date   INR 3.9* 06/21/2014   INR 2.57* 06/03/2014   INR 2.71* 06/02/2014    INR3.012/14/15 INR 3.912/21/15   Wt Readings from Last 3 Encounters:  07/07/14 181 lb 6.4 oz (82.283 kg)  06/11/14 193 lb (87.544 kg)  06/03/14 215 lb 13.3 oz (97.9 kg)     Past Medical History  Diagnosis Date  . Hypertension   . Myasthenia gravis   . Cellulitis   . Diverticula of small intestine     Current Outpatient Prescriptions  Medication Sig Dispense Refill  . alendronate (FOSAMAX) 70 MG tablet Take 70 mg by mouth every Sunday. Take with a full glass of water on an empty stomach.    . brimonidine-timolol (COMBIGAN) 0.2-0.5 % ophthalmic solution Place 1 drop into the left eye every 12 (twelve) hours.    . calcium carbonate (TUMS - DOSED IN MG ELEMENTAL CALCIUM) 500 MG chewable tablet Chew 1 tablet by mouth daily.    . cholecalciferol (VITAMIN D) 1000 UNITS tablet Take  1,000 Units by mouth daily.    Marland Kitchen diltiazem (CARDIZEM CD) 180 MG 24 hr capsule Take 1 capsule (180 mg total) by mouth daily.    . metoprolol (LOPRESSOR) 100 MG tablet Take 1 tablet (100 mg total) by mouth 2 (two) times daily.    . Multiple Vitamins-Minerals (MULTIVITAMIN WITH MINERALS) tablet Take 1 tablet by mouth daily.    . Nutritional Supplements (PROMOD) LIQD Take 1 oz by mouth 2 (two) times daily. Wound healing    . vitamin C (ASCORBIC ACID) 500 MG tablet  Take 500 mg by mouth daily.    Marland Kitchen warfarin (COUMADIN) 2 MG tablet Take 1 tablet (2 mg total) by mouth daily. (Patient taking differently: Take 2 mg by mouth daily. Facility changed to )     No current facility-administered medications for this visit.     Allergies: Review of patient's allergies indicates no known allergies.   Social History: The patient  reports that he has quit smoking. He has never used smokeless tobacco. He reports that he does not drink alcohol or use illicit drugs.   Family History: The patient's family history includes Heart failure in his father. There is no history of Heart attack or Stroke.    ROS: Please see the history of present illness. All other systems reviewed and negative.    PHYSICAL EXAM: VS: BP 118/64 mmHg  Pulse 67  Ht  (1.803 m)  Wt 181 lb 6.4 oz (82.283 kg)  BMI 25.31 kg/m2 Well nourished, well developed, in no acute distress  HEENT: normal  Neck: no JVD  Cardiac: normal S1, S2; irregularly irregular rhythm; no murmur  Lungs: clear to auscultation bilaterally, no wheezing, rhonchi or rales  Abd: soft, nontender, no hepatomegaly  Ext: trace bilateral ankle edema  Skin: warm and dry  Neuro: CNs 2-12 intact, no focal abnormalities noted  EKG: AFib, HR 67, no change from prior tracing  ASSESSMENT AND PLAN:  1. Persistent Atrial Fibrillation: CHADS2-VASc=3. He will need to continue Coumadin. He remains in AFib. He is likely symptomatic from this. We contacted his home health agency. His last INR 12/28 was reportedly 3. It is certainly worth trying DCCV to restore NSR. Even if he does not maintain sinus rhythm but has significant symptomatic improvement, we will know that pursuing a strategy of rhythm control is most appropriate. Otherwise, we can certainly continue a strategy of rate control.  - Risks and benefits of cardioversion were discussed with the patient's wife and he agrees to  proceed.  - Obtain INR today. We'll plan on repeating INR 1 day prior to procedure.  - Hold diltiazem a.m. of procedure. Consider discontinuing this if heart rate remains slow in sinus rhythm. 2. Chronic Combined Systolic and Diastolic CHF: Volume stable. He is currently off of diuretics.  - Check BMET. 3. Cardiomyopathy: Etiology not clear. He is currently rate controlled with diltiazem and metoprolol. If he has evidence of worsening LV function over time, we will need to stop his calcium channel blocker. He is currently not on ACEI as he does have CKD.  4. Perforated Small Bowel s/p Resection: Follow-up with surgery as planned. 5. Hypertension: Controlled. 6. Chronic Kidney Disease: Recent Creatinine stable. Repeat BMET.   Disposition: FU with Dr. Antoine Poche in [place] in 2 weeks or me in [place] if no appointments are available.   Signed, Brynda Rim, MHS 07/07/2014 2:46 PM  [facility] [address] [place], [place], [place] [address] Phone: [phone]; Fax: [phone]  [phone]        Patient for DCCV; INR has been therapeutic for > 21 days; no changes. Olga MillersBrian Crenshaw

## 2014-07-14 ENCOUNTER — Encounter (HOSPITAL_COMMUNITY): Payer: Self-pay | Admitting: Cardiology

## 2014-07-28 ENCOUNTER — Encounter: Payer: Self-pay | Admitting: Cardiology

## 2014-07-28 ENCOUNTER — Ambulatory Visit (INDEPENDENT_AMBULATORY_CARE_PROVIDER_SITE_OTHER): Payer: Medicare Other | Admitting: Cardiology

## 2014-07-28 VITALS — BP 110/52 | HR 64 | Ht 71.0 in | Wt 168.0 lb

## 2014-07-28 DIAGNOSIS — I4819 Other persistent atrial fibrillation: Secondary | ICD-10-CM

## 2014-07-28 DIAGNOSIS — I481 Persistent atrial fibrillation: Secondary | ICD-10-CM

## 2014-07-28 DIAGNOSIS — I10 Essential (primary) hypertension: Secondary | ICD-10-CM

## 2014-07-28 NOTE — Progress Notes (Signed)
HPI The patient presents for evaluation of atrial fibrillation. He was seen by our practice in November. He had a perforated small bowel related to diverticulitis. He required resection and repair. Postoperative course was complicated by atrial fibrillation with a rapid rate. He did have a mildly reduced ejection fraction noted of 45-50%. He had had this noted in the past however in 2004 as well. He has had some issues with volume that have been addressed as an outpatient. He was treated with anticoagulation and rate control and on January 12 of this year had cardioversion successfully to normal sinus rhythm.  He reports that since he was last seen he is doing much better.  The patient denies any new symptoms such as chest discomfort, neck or arm discomfort. There has been no new shortness of breath, PND or orthopnea. There have been no reported palpitations, presyncope or syncope.  He is back to being active.    No Known Allergies  Current Outpatient Prescriptions  Medication Sig Dispense Refill  . alendronate (FOSAMAX) 70 MG tablet Take 70 mg by mouth every Sunday. Take with a full glass of water on an empty stomach.    . brimonidine-timolol (COMBIGAN) 0.2-0.5 % ophthalmic solution Place 1 drop into the left eye every 12 (twelve) hours.    . calcium carbonate (TUMS - DOSED IN MG ELEMENTAL CALCIUM) 500 MG chewable tablet Chew 1 tablet by mouth daily.    . cholecalciferol (VITAMIN D) 1000 UNITS tablet Take 1,000 Units by mouth daily.    Marland Kitchen. diltiazem (CARDIZEM CD) 180 MG 24 hr capsule Take 1 capsule (180 mg total) by mouth daily.    . metoprolol (LOPRESSOR) 100 MG tablet Take 1 tablet (100 mg total) by mouth 2 (two) times daily.    . Multiple Vitamins-Minerals (MULTIVITAMIN WITH MINERALS) tablet Take 1 tablet by mouth daily.    . Nutritional Supplements (PROMOD) LIQD Take 1 oz by mouth 2 (two) times daily. Wound healing    . potassium chloride SA (K-DUR,KLOR-CON) 20 MEQ tablet Take 1 tablet (20  mEq total) by mouth as needed (only when you take the lasix).    . vitamin C (ASCORBIC ACID) 500 MG tablet Take 500 mg by mouth daily.    Marland Kitchen. warfarin (COUMADIN) 2 MG tablet Take 1 tablet (2 mg total) by mouth daily. (Patient taking differently: Take 2 mg by mouth daily. Facility changed to 3mg )    . warfarin (COUMADIN) 3 MG tablet     . amLODipine (NORVASC) 5 MG tablet     . furosemide (LASIX) 20 MG tablet Take 1 tablet (20 mg total) by mouth as needed for edema (SWELLING). (Patient not taking: Reported on 07/28/2014)    . irbesartan (AVAPRO) 150 MG tablet     . metolazone (ZAROXOLYN) 5 MG tablet     . mupirocin ointment (BACTROBAN) 2 %     . ONE TOUCH ULTRA TEST test strip     . predniSONE (DELTASONE) 10 MG tablet      No current facility-administered medications for this visit.    Past Medical History  Diagnosis Date  . Hypertension   . Myasthenia gravis   . Cellulitis   . Diverticula of small intestine     Past Surgical History  Procedure Laterality Date  . Eye surgery    . Abdominal surgery    . Cholecystectomy    . Small intestine surgery  2006    perf SB diverticulitis  . Laparotomy N/A 05/22/2014  Procedure: EXPLORATORY LAPAROTOMY;  Surgeon: Atilano Ina, MD;  Location: [facility] OR;  Service: General;  Laterality: N/A;  . Bowel resection N/A 05/22/2014    Procedure: SMALL BOWEL RESECTION;  Surgeon: Atilano Ina, MD;  Location: [facility] OR;  Service: General;  Laterality: N/A;  . Cardioversion N/A 07/13/2014    Procedure: CARDIOVERSION;  Surgeon: Lewayne Bunting, MD;  Location: [facility] ENDOSCOPY;  Service: Cardiovascular;  Laterality: N/A;    ROS:  As stated in the HPI and negative for all other systems.  PHYSICAL EXAM BP 110/52 mmHg  Pulse 64  Ht  (1.803 m)  Wt 168 lb (76.204 kg)  BMI 23.44 kg/m2 GENERAL:  Well appearing HEENT:  Pupils equal round and reactive, fundi not visualized, oral mucosa unremarkable NECK:  No jugular venous distention, waveform within normal  limits, carotid upstroke brisk and symmetric, no bruits, no thyromegaly LYMPHATICS:  No cervical, inguinal adenopathy LUNGS:  Clear to auscultation bilaterally BACK:  No CVA tenderness CHEST:  Unremarkable HEART:  PMI not displaced or sustained,S1 and S2 within normal limits, no S3, no S4, no clicks, no rubs, no murmurs ABD:  Flat, positive bowel sounds normal in frequency in pitch, no bruits, no rebound, no guarding, no midline pulsatile mass, no hepatomegaly, no splenomegaly EXT:  2 plus pulses throughout, no edema, no cyanosis no clubbing SKIN:  No rashes no nodules NEURO:  Cranial nerves II through XII grossly intact, motor grossly intact throughout PSYCH:  Cognitively intact, oriented to person place and time   ASSESSMENT AND PLAN  Persistent Atrial Fibrillation: CHADS2-VASc=3. He is status post cardioversion. However, I don't think that this patient has had atrial fibrillation prior to his acute illness. My inclination is now he's back in sinus rhythm if he has continued sinus rhythm at his next visit I will likely discontinue his anticoagulation.  Chronic Combined Systolic and Diastolic CHF:  He seems to be euvolemic. No change in therapy is indicated.  Cardiomyopathy:  He is currently not on ACEI as he does have CKD.  He's had a stable mildly reduced ejection fraction. He will continue the meds as listed.  Hypertension: Controlled.  Chronic Kidney Disease: Recent Creatinine stable. I will follow this in the future.

## 2014-07-28 NOTE — Patient Instructions (Signed)
The current medical regimen is effective;  continue present plan and medications.  Please have your protime checked at Dr Joyce CopaNyland's office on 08/19/2014 at 8:45 am.  Follow up in 3 months with Dr Antoine [place]Hochrein in the [place].  Thank you for choosing [facility]!!

## 2014-08-02 ENCOUNTER — Other Ambulatory Visit (INDEPENDENT_AMBULATORY_CARE_PROVIDER_SITE_OTHER): Payer: Self-pay | Admitting: General Surgery

## 2014-08-02 ENCOUNTER — Ambulatory Visit
Admission: RE | Admit: 2014-08-02 | Discharge: 2014-08-02 | Disposition: A | Discharge status: Home / Self Care | Payer: Medicare Other | Source: Ambulatory Visit | Attending: General Surgery | Admitting: General Surgery

## 2014-08-02 DIAGNOSIS — K5900 Constipation, unspecified: Secondary | ICD-10-CM

## 2014-09-01 ENCOUNTER — Other Ambulatory Visit: Payer: Self-pay | Admitting: Physician Assistant

## 2014-10-27 ENCOUNTER — Ambulatory Visit (INDEPENDENT_AMBULATORY_CARE_PROVIDER_SITE_OTHER): Payer: Medicare Other | Admitting: Cardiology

## 2014-10-27 ENCOUNTER — Encounter: Payer: Self-pay | Admitting: Cardiology

## 2014-10-27 VITALS — BP 127/69 | HR 68 | Ht 71.0 in | Wt 194.0 lb

## 2014-10-27 DIAGNOSIS — I480 Paroxysmal atrial fibrillation: Secondary | ICD-10-CM | POA: Diagnosis not present

## 2014-10-27 NOTE — Progress Notes (Signed)
HPI The patient presents for evaluation of atrial fibrillation. We saw him for this during a hospitalization when he had surgery for perforated diverticulitis. He had cardioversion after he was discharged and was maintaining sinus rhythm when I saw him recently. He does have a mildly reduced ejection fraction.  Since I last saw him he has done well. He has been on anticoagulation. He denies any palpitations, presyncope or syncope. He denies any chest pressure, neck or arm discomfort. He thinks it is recovering from his surgery. He has some very mild occasional abdominal discomfort. He otherwise feels well. He's not having any new shortness of breath, PND or orthopnea. He does have some mild chronic left greater than right lower extremity edema but he's had no weight gain.  No Known Allergies  Current Outpatient Prescriptions  Medication Sig Dispense Refill  . alendronate (FOSAMAX) 70 MG tablet Take 70 mg by mouth every Sunday. Take with a full glass of water on an empty stomach.    . brimonidine-timolol (COMBIGAN) 0.2-0.5 % ophthalmic solution Place 1 drop into the left eye every 12 (twelve) hours.    . calcium carbonate (TUMS - DOSED IN MG ELEMENTAL CALCIUM) 500 MG chewable tablet Chew 1 tablet by mouth daily.    Marland Kitchen diltiazem (CARDIZEM CD) 180 MG 24 hr capsule Take 1 capsule (180 mg total) by mouth daily.    . furosemide (LASIX) 20 MG tablet Take 40 mg by mouth daily.     . metoprolol (LOPRESSOR) 100 MG tablet Take 1 tablet (100 mg total) by mouth 2 (two) times daily.    . Multiple Vitamins-Minerals (MULTIVITAMIN WITH MINERALS) tablet Take 1 tablet by mouth daily.    . Nutritional Supplements (PROMOD) LIQD Take 1 oz by mouth 2 (two) times daily. Wound healing    . ONE TOUCH ULTRA TEST test strip     . potassium chloride SA (K-DUR,KLOR-CON) 20 MEQ tablet Take 1 tablet (20 mEq total) by mouth as needed (only when you take the lasix).    . predniSONE (DELTASONE) 5 MG tablet Take 5 mg by mouth  daily.     Marland Kitchen warfarin (COUMADIN) 3 MG tablet Take 3 mg by mouth daily at 6 PM.      No current facility-administered medications for this visit.    Past Medical History  Diagnosis Date  . Hypertension   . Myasthenia gravis   . Cellulitis   . Diverticula of small intestine     Past Surgical History  Procedure Laterality Date  . Eye surgery    . Abdominal surgery    . Cholecystectomy    . Small intestine surgery  2006    perf SB diverticulitis  . Laparotomy N/A 05/22/2014    Procedure: EXPLORATORY LAPAROTOMY;  Surgeon: Atilano Ina, MD;  Location: [facility] OR;  Service: General;  Laterality: N/A;  . Bowel resection N/A 05/22/2014    Procedure: SMALL BOWEL RESECTION;  Surgeon: Atilano Ina, MD;  Location: [facility] OR;  Service: General;  Laterality: N/A;  . Cardioversion N/A 07/13/2014    Procedure: CARDIOVERSION;  Surgeon: Lewayne Bunting, MD;  Location: [facility] ENDOSCOPY;  Service: Cardiovascular;  Laterality: N/A;    ROS:  As stated in the HPI and negative for all other systems.  PHYSICAL EXAM BP 127/69 mmHg  Pulse 68  Ht  (1.803 m)  Wt 194 lb (87.998 kg)  BMI 27.07 kg/m2 GENERAL:  Well appearing HEENT:  Pupils equal round and reactive, fundi not visualized, oral mucosa  unremarkable NECK:  No jugular venous distention, waveform within normal limits, carotid upstroke brisk and symmetric, no bruits, no thyromegaly LYMPHATICS:  No cervical, inguinal adenopathy LUNGS:  Clear to auscultation bilaterally BACK:  No CVA tenderness CHEST:  Unremarkable HEART:  PMI not displaced or sustained,S1 and S2 within normal limits, no S3, no S4, no clicks, no rubs, no murmurs ABD:  Flat, positive bowel sounds normal in frequency in pitch, no bruits, no rebound, no guarding, no midline pulsatile mass, no hepatomegaly, no splenomegaly EXT:  2 plus pulses throughout, no edema, no cyanosis no clubbing SKIN:  No rashes no nodules NEURO:  Cranial nerves II through XII grossly intact, motor grossly  intact throughout PSYCH:  Cognitively intact, oriented to person place and time  EKG:  Sinus rhythm, rate 68, left bundle branch block.  10/27/2014  ASSESSMENT AND PLAN  Persistent Atrial Fibrillation: CHADS2-VASc=3. He is status post cardioversion.  However, I don't think that this patient has had atrial fibrillation prior to his acute illness. He has had no obvious recurrence of this. Therefore, I don't think he needs continued anticoagulation. He understands there is a small risk of recurrent atrial fibrillation since we've seen it before and that he is probably at some risk for stroke but I think this is minimal. He would rather discontinue the medication.  Cardiomyopathy:  He is currently not on ACEI as he does have CKD. He's had a stable mildly reduced ejection fraction. I will look at this again in the fall with another echocardiogram.  Hypertension: Controlled.  Chronic Kidney Disease: Recent Creatinine stable and I reviewed this. I will follow this in the future.

## 2014-10-27 NOTE — Patient Instructions (Signed)
Medication Instructions:  Please stop your Warfarin. Continue all other medications as listed.  Labwork: None  Testing/Procedures: None  Follow-Up: Follow up in 6 months with Dr. Antoine PocheHochrein.  You will receive a letter in the mail 2 months before you are due.  Please call us when you receive this letter to schedule your follow up appointment.  Thank you for choosing [facility]!!

## 2014-12-23 NOTE — Addendum Note (Signed)
Addended by: Reesa Chew on: 12/23/2014 09:20 AM   Modules accepted: Orders

## 2015-01-13 ENCOUNTER — Encounter (HOSPITAL_COMMUNITY): Payer: Self-pay | Admitting: Emergency Medicine

## 2015-01-13 ENCOUNTER — Emergency Department (HOSPITAL_COMMUNITY)
Admission: EM | Admit: 2015-01-13 | Discharge: 2015-01-13 | Disposition: A | Discharge status: Home / Self Care | Payer: Medicare Other | Attending: Emergency Medicine | Admitting: Emergency Medicine

## 2015-01-13 DIAGNOSIS — R1084 Generalized abdominal pain: Secondary | ICD-10-CM | POA: Insufficient documentation

## 2015-01-13 DIAGNOSIS — Z87891 Personal history of nicotine dependence: Secondary | ICD-10-CM | POA: Insufficient documentation

## 2015-01-13 DIAGNOSIS — Z8719 Personal history of other diseases of the digestive system: Secondary | ICD-10-CM | POA: Insufficient documentation

## 2015-01-13 DIAGNOSIS — Z7952 Long term (current) use of systemic steroids: Secondary | ICD-10-CM | POA: Insufficient documentation

## 2015-01-13 DIAGNOSIS — Z8669 Personal history of other diseases of the nervous system and sense organs: Secondary | ICD-10-CM | POA: Diagnosis not present

## 2015-01-13 DIAGNOSIS — R1033 Periumbilical pain: Secondary | ICD-10-CM | POA: Insufficient documentation

## 2015-01-13 DIAGNOSIS — Z872 Personal history of diseases of the skin and subcutaneous tissue: Secondary | ICD-10-CM | POA: Diagnosis not present

## 2015-01-13 DIAGNOSIS — Z79899 Other long term (current) drug therapy: Secondary | ICD-10-CM | POA: Diagnosis not present

## 2015-01-13 DIAGNOSIS — I10 Essential (primary) hypertension: Secondary | ICD-10-CM | POA: Insufficient documentation

## 2015-01-13 NOTE — Discharge Instructions (Signed)

## 2015-01-13 NOTE — ED Provider Notes (Signed)
CSN: [id number]     Arrival date & time 01/13/15  1910 History   First MD Initiated Contact with Patient 01/13/15 2204     Chief Complaint  Patient presents with  . Abdominal Pain     (Consider location/radiation/quality/duration/timing/severity/associated sxs/prior Treatment) Patient is a 81 y.o. male presenting with abdominal pain.  Abdominal Pain Pain location:  Generalized Pain quality: cramping   Pain radiates to:  Does not radiate Pain severity:  Moderate Onset quality:  Gradual Duration:  1 day Timing:  Constant Progression:  Unchanged Chronicity:  New Context: not eating, not recent illness and not trauma   Relieved by:  Nothing Worsened by:  Nothing tried Ineffective treatments:  None tried Associated symptoms: no anorexia, no chills, no cough, no diarrhea, no dysuria, no fever, no nausea, no shortness of breath and no vomiting     Past Medical History  Diagnosis Date  . Hypertension   . Myasthenia gravis   . Cellulitis   . Diverticula of small intestine    Past Surgical History  Procedure Laterality Date  . Eye surgery    . Abdominal surgery    . Cholecystectomy    . Small intestine surgery  2006    perf SB diverticulitis  . Laparotomy N/A 05/22/2014    Procedure: EXPLORATORY LAPAROTOMY;  Surgeon: Atilano Ina, MD;  Location: [facility] OR;  Service: General;  Laterality: N/A;  . Bowel resection N/A 05/22/2014    Procedure: SMALL BOWEL RESECTION;  Surgeon: Atilano Ina, MD;  Location: [facility] OR;  Service: General;  Laterality: N/A;  . Cardioversion N/A 07/13/2014    Procedure: CARDIOVERSION;  Surgeon: Lewayne Bunting, MD;  Location: [facility] ENDOSCOPY;  Service: Cardiovascular;  Laterality: N/A;   Family History  Problem Relation Age of Onset  . Heart failure Father   . Heart attack Neg Hx   . Stroke Neg Hx    History  Substance Use Topics  . Smoking status: Former Games developer  . Smokeless tobacco: Never Used  . Alcohol Use: No    Review of Systems  Constitutional:  Negative for fever and chills.  Respiratory: Negative for cough and shortness of breath.   Gastrointestinal: Positive for abdominal pain. Negative for nausea, vomiting, diarrhea and anorexia.  Genitourinary: Negative for dysuria.  All other systems reviewed and are negative.     Allergies  Review of patient's allergies indicates no known allergies.  Home Medications   Prior to Admission medications   Medication Sig Start Date End Date Taking? Authorizing Provider  alendronate (FOSAMAX) 70 MG tablet Take 70 mg by mouth every Sunday. Take with a full glass of water on an empty stomach.    Historical Provider, MD  brimonidine-timolol (COMBIGAN) 0.2-0.5 % ophthalmic solution Place 1 drop into the left eye every 12 (twelve) hours.    Historical Provider, MD  calcium carbonate (TUMS - DOSED IN MG ELEMENTAL CALCIUM) 500 MG chewable tablet Chew 1 tablet by mouth daily.    Historical Provider, MD  diltiazem (CARDIZEM CD) 180 MG 24 hr capsule Take 1 capsule (180 mg total) by mouth daily. 06/03/14   Barnetta Chapel, PA-C  furosemide (LASIX) 20 MG tablet Take 40 mg by mouth daily.  10/15/14   Historical Provider, MD  metoprolol (LOPRESSOR) 100 MG tablet Take 1 tablet (100 mg total) by mouth 2 (two) times daily. 06/11/14   Beatrice Lecher, PA-C  Multiple Vitamins-Minerals (MULTIVITAMIN WITH MINERALS) tablet Take 1 tablet by mouth daily.    Historical Provider, MD  Nutritional Supplements (PROMOD) LIQD Take 1 oz by mouth 2 (two) times daily. Wound healing    Historical Provider, MD  ONE TOUCH ULTRA TEST test strip  07/01/14   Historical Provider, MD  potassium chloride SA (K-DUR,KLOR-CON) 20 MEQ tablet Take 1 tablet (20 mEq total) by mouth as needed (only when you take the lasix). 07/09/14   Beatrice LecherScott T Weaver, PA-C  predniSONE (DELTASONE) 5 MG tablet Take 5 mg by mouth daily.  10/14/14   Historical Provider, MD   BP 140/67 mmHg  Pulse 59  Temp(Src) 98 F (36.7 C) (Oral)  Resp 20  Wt 199 lb 9.6 oz (90.538 kg)   SpO2 96% Physical Exam  Constitutional: He is oriented to person, place, and time. He appears well-developed and well-nourished.  HENT:  Head: Normocephalic and atraumatic.  Eyes: Conjunctivae and EOM are normal.  Neck: Normal range of motion. Neck supple.  Cardiovascular: Normal rate, regular rhythm and normal heart sounds.   Pulmonary/Chest: Effort normal and breath sounds normal. No respiratory distress.  Abdominal: He exhibits no distension. There is tenderness (very mild) in the periumbilical area. There is no rebound and no guarding.  Musculoskeletal: Normal range of motion.  Neurological: He is alert and oriented to person, place, and time.  Skin: Skin is warm and dry.  Vitals reviewed.   ED Course  Procedures (including critical care time) Labs Review Labs Reviewed  LIPASE, BLOOD  COMPREHENSIVE METABOLIC PANEL  CBC  URINALYSIS, ROUTINE W REFLEX [facility]CROSCOPIC ([facility])  URINALYSIS, ROUTINE W REFLEX [facility]CROSCOPIC [facility]NOT [facility])  URINE MICROSCOPIC-ADD ON  CBC  COMPREHENSIVE METABOLIC PANEL  DIFFERENTIAL  LIPASE, BLOOD    Imaging Review No results found.   EKG Interpretation None      MDM   Final diagnoses:  Generalized abdominal pain    81 y.o. male with pertinent PMH of perforated diverticulitis with partial bowel resection presents with acute on chronic abdominal pain, present 24 hours. On arrival the patient is essentially a symptomatically with the exception of some mild periumbilical tenderness. He states he has intermittently had cramping abdominal pain since his surgery 8 months ago. He denies fever, nausea, vomiting, diarrhea, constipation. He states his symptoms were mildly worse yesterday so presents today. Discussed broad differential including diverticulitis, very mild perforation, however the patient has no signs of peritonitis on exam. We discussed the utility of a CT scan and this was offered with patient, he refused. Feel this is reasonable given  that he is essentially pain-free. Discharged home in stable condition with standard return precautions..    I have reviewed all laboratory and imaging studies if ordered as above  1. Generalized abdominal pain         Mirian MoMatthew Gentry, MD 01/14/15 [phone]

## 2015-01-13 NOTE — ED Notes (Signed)
Pt. reports mid abdominal pain onset yesterday , denies nausea or vomitting , no diarrhea , denies fever or chills.

## 2015-01-14 LAB — COMPREHENSIVE METABOLIC PANEL
ALBUMIN: 3.3 g/dL — AB (ref 3.5–5.0)
ALK PHOS: 69 U/L (ref 38–126)
ALT: 19 U/L (ref 17–63)
AST: 23 U/L (ref 15–41)
Anion gap: 12 (ref 5–15)
BUN: 14 mg/dL (ref 6–20)
CHLORIDE: 98 mmol/L — AB (ref 101–111)
CO2: 25 mmol/L (ref 22–32)
Calcium: 9.3 mg/dL (ref 8.9–10.3)
Creatinine, Ser: 1.16 mg/dL (ref 0.61–1.24)
GFR calc non Af Amer: 57 mL/min — ABNORMAL LOW (ref >60)
Glucose, Bld: 192 mg/dL — ABNORMAL HIGH (ref 65–99)
POTASSIUM: 3.9 mmol/L (ref 3.5–5.1)
Sodium: 135 mmol/L (ref 135–145)
TOTAL PROTEIN: 6.2 g/dL — AB (ref 6.5–8.1)
Total Bilirubin: 0.6 mg/dL (ref 0.3–1.2)

## 2015-01-14 LAB — DIFFERENTIAL
BASOS PCT: 0 % (ref 0–1)
Basophils Absolute: 0 10*3/uL (ref 0.0–0.1)
Eosinophils Absolute: 0 10*3/uL (ref 0.0–0.7)
Eosinophils Relative: 0 % (ref 0–5)
LYMPHS PCT: 35 % (ref 12–46)
Lymphs Abs: 4.6 10*3/uL — ABNORMAL HIGH (ref 0.7–4.0)
Monocytes Absolute: 1.1 10*3/uL — ABNORMAL HIGH (ref 0.1–1.0)
Monocytes Relative: 8 % (ref 3–12)
NEUTROS PCT: 57 % (ref 43–77)
Neutro Abs: 7.4 10*3/uL (ref 1.7–7.7)

## 2015-01-14 LAB — CBC
HCT: 44.1 % (ref 39.0–52.0)
Hemoglobin: 15.1 g/dL (ref 13.0–17.0)
MCH: 32.4 pg (ref 26.0–34.0)
MCHC: 34.2 g/dL (ref 30.0–36.0)
MCV: 94.6 fL (ref 78.0–100.0)
Platelets: 624 10*3/uL — ABNORMAL HIGH (ref 150–400)
RBC: 4.66 MIL/uL (ref 4.22–5.81)
RDW: 17.8 % — ABNORMAL HIGH (ref 11.5–15.5)
WBC: 13.2 10*3/uL — ABNORMAL HIGH (ref 4.0–10.5)

## 2015-01-14 LAB — URINE MICROSCOPIC-ADD ON

## 2015-01-14 LAB — URINALYSIS, ROUTINE W REFLEX MICROSCOPIC
Bilirubin Urine: NEGATIVE
Glucose, UA: >1000 mg/dL — AB
HGB URINE DIPSTICK: NEGATIVE
KETONES UR: NEGATIVE mg/dL
Leukocytes, UA: NEGATIVE
NITRITE: NEGATIVE
Protein, ur: NEGATIVE mg/dL
Specific Gravity, Urine: 1.025 (ref 1.005–1.030)
Urobilinogen, UA: 1 mg/dL (ref 0.0–1.0)
pH: 6 (ref 5.0–8.0)

## 2015-01-14 LAB — LIPASE, BLOOD: Lipase: 39 U/L (ref 22–51)

## 2015-08-03 DEATH — deceased

## 2016-08-10 IMAGING — CR DG CHEST 1V PORT
1 series · 1 of 1 positions shown · non-contrast
Comparison: 05/22/2014 earlier.

CLINICAL DATA: Intubation and nasogastric tube placement.

EXAM:
PORTABLE CHEST - 1 VIEW

[AP]
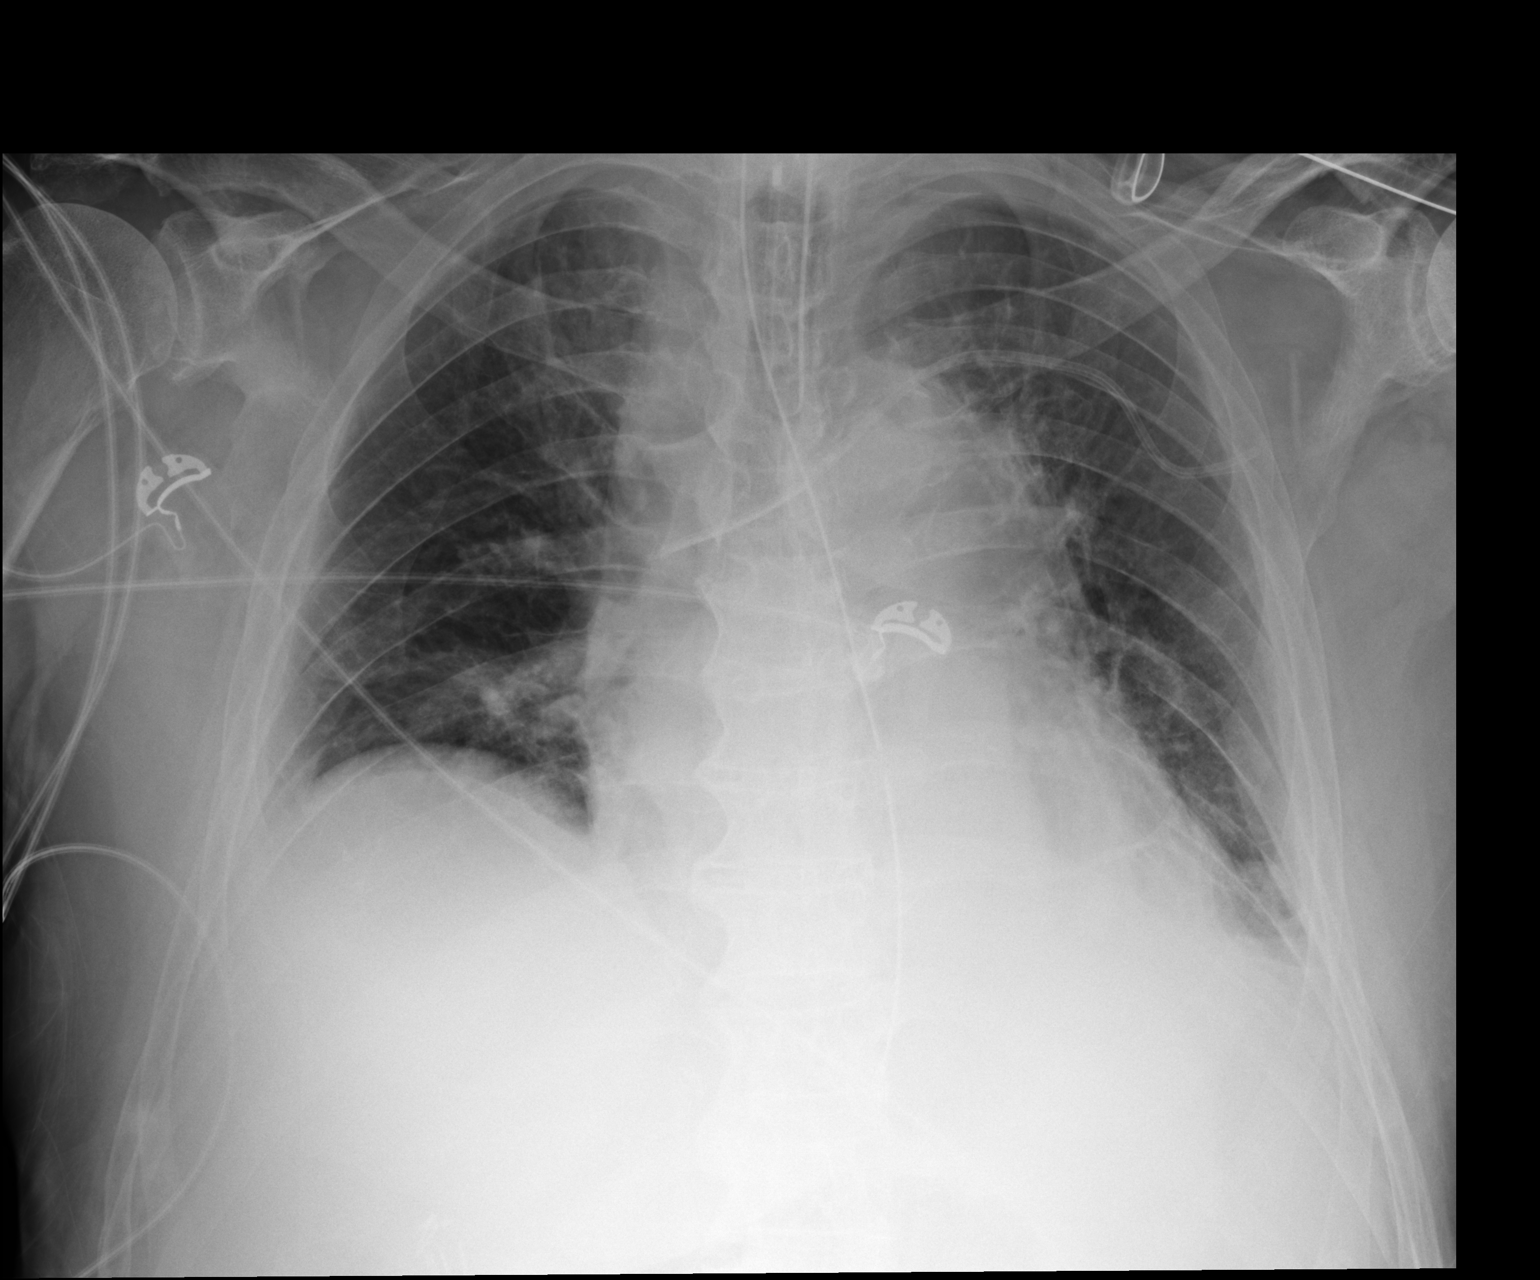

[1 of 1 positions shown; findings below may reference images not displayed]

FINDINGS: There has been placement of an endotracheal tube with tip 4.1 cm
above the carina. Nasogastric tube is been placed with tip in the
region of the gastroesophageal junction and side-port over the
distal esophagus. The nasogastric tube could be advanced another
8-10 cm. Left subclavian central venous catheter is present with tip
obliquely oriented over the region of the SVC.

Lungs are adequately inflated with mild stable elevation of the
right hemidiaphragm. No evidence of pneumothorax. There is continued
opacification over the left base which may be due to atelectasis or
infection. Cannot exclude a small amount of left pleural fluid.
There is stable cardiomegaly. Remainder of the exam is unchanged.
IMPRESSION: Continued left base opacification without significant change which
may be due to atelectasis and effusion, although cannot exclude
infection.

Tubes and lines as described. Note that the nasogastric tube could
be advanced another 8-10 cm.

## 2016-08-11 IMAGING — CR DG CHEST 1V PORT
1 series · 1 of 1 positions shown · non-contrast
Comparison: Portable exam 6886 hr compared 05/22/2014

CLINICAL DATA: Acute respiratory failure, history myasthenia
gravis, hypertension, former smoker

EXAM:
PORTABLE CHEST - 1 VIEW

[AP]
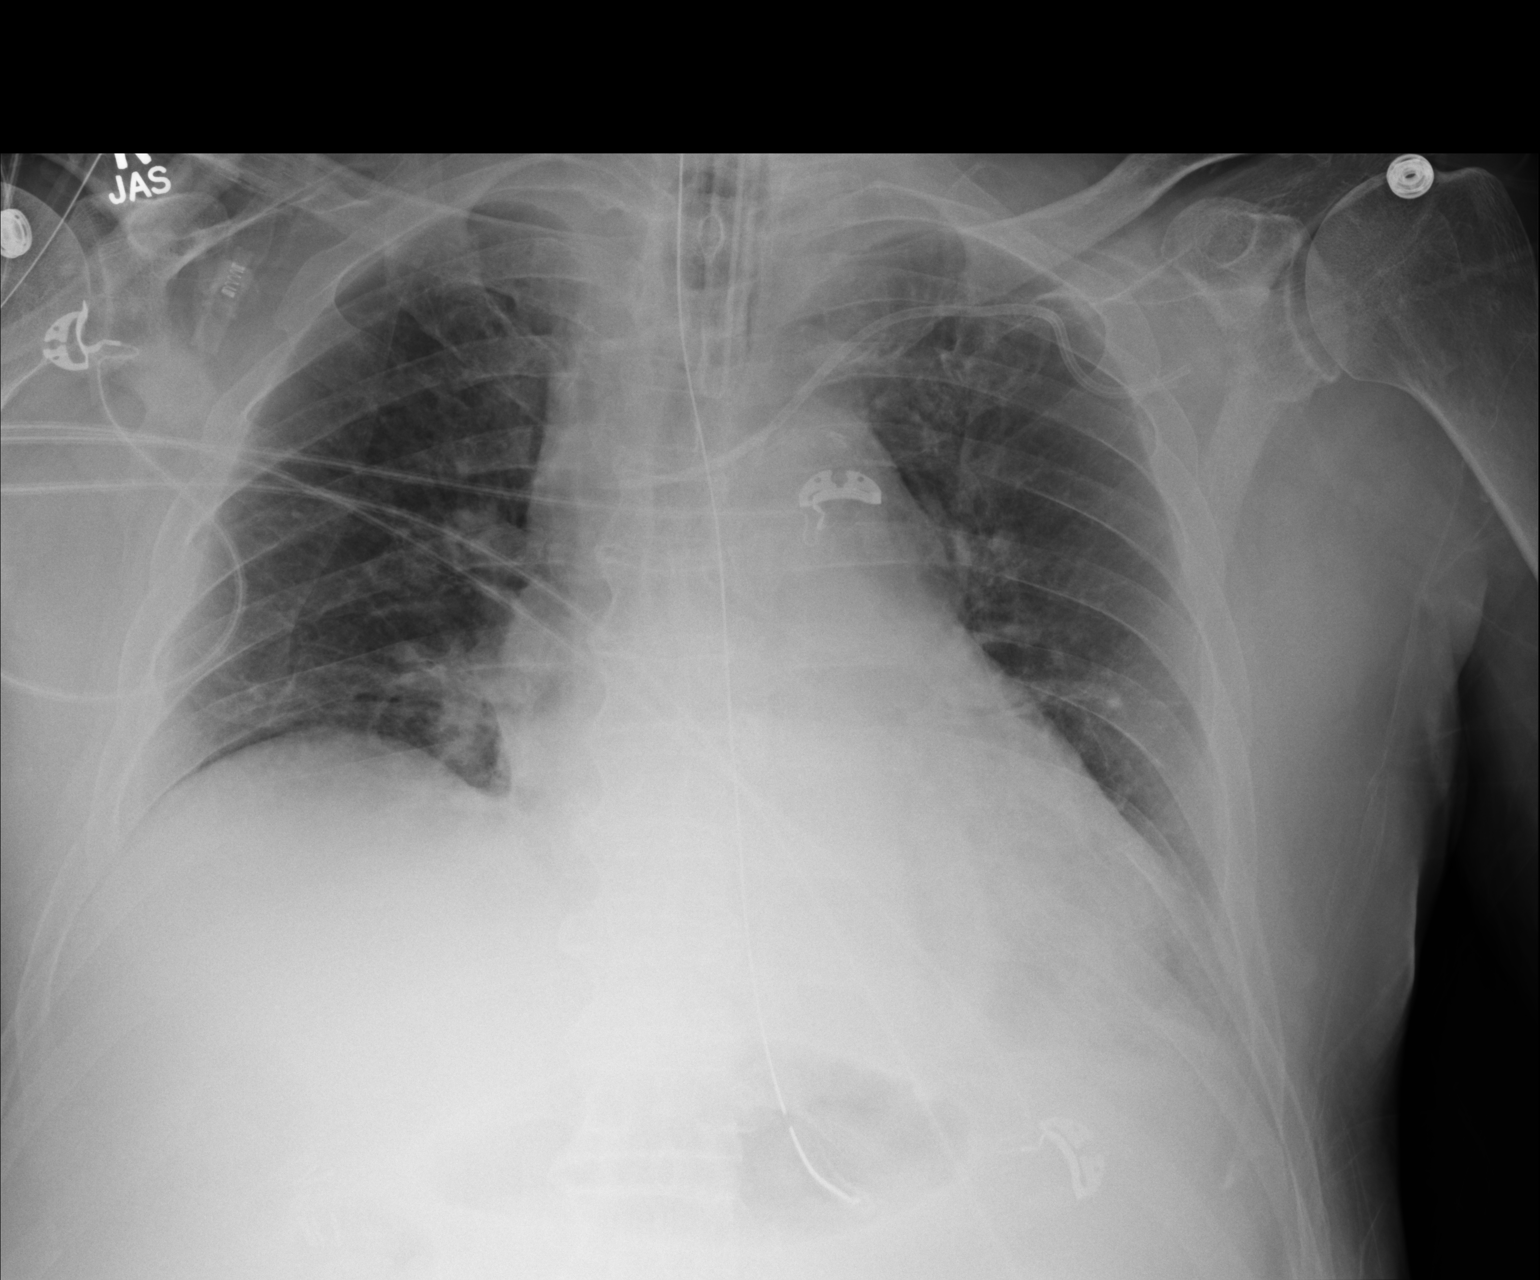

[1 of 1 positions shown; findings below may reference images not displayed]

FINDINGS: Tip of endotracheal tube projects 5.5 cm above carina.

Nasogastric tube extends into stomach.

LEFT subclavian central venous catheter tip projects over SVC.

Enlargement of cardiac silhouette.

Mediastinal contours and pulmonary vascularity normal.

Chronic elevation of RIGHT diaphragm.

Minimal RIGHT basilar atelectasis with more pronounced atelectasis
versus consolidation in LEFT lower lobe.

No pleural effusion or pneumothorax.
IMPRESSION: Minimal RIGHT basilar atelectasis with persistent atelectasis versus
consolidation in LEFT lower lobe.

Enlargement of cardiac silhouette.

## 2016-08-20 IMAGING — CR DG CHEST 1V PORT
1 series · 1 of 1 positions shown · non-contrast
Comparison: Portable exam 8761 hr compared to 05/23/2014

CLINICAL DATA: Shortness of breath with exertion, leukocytosis,
prior abdominal surgery 11 days ago, fluid retention, personal
history of myasthenia gravis, hypertension, former smoker

EXAM:
PORTABLE CHEST - 1 VIEW

[AP]
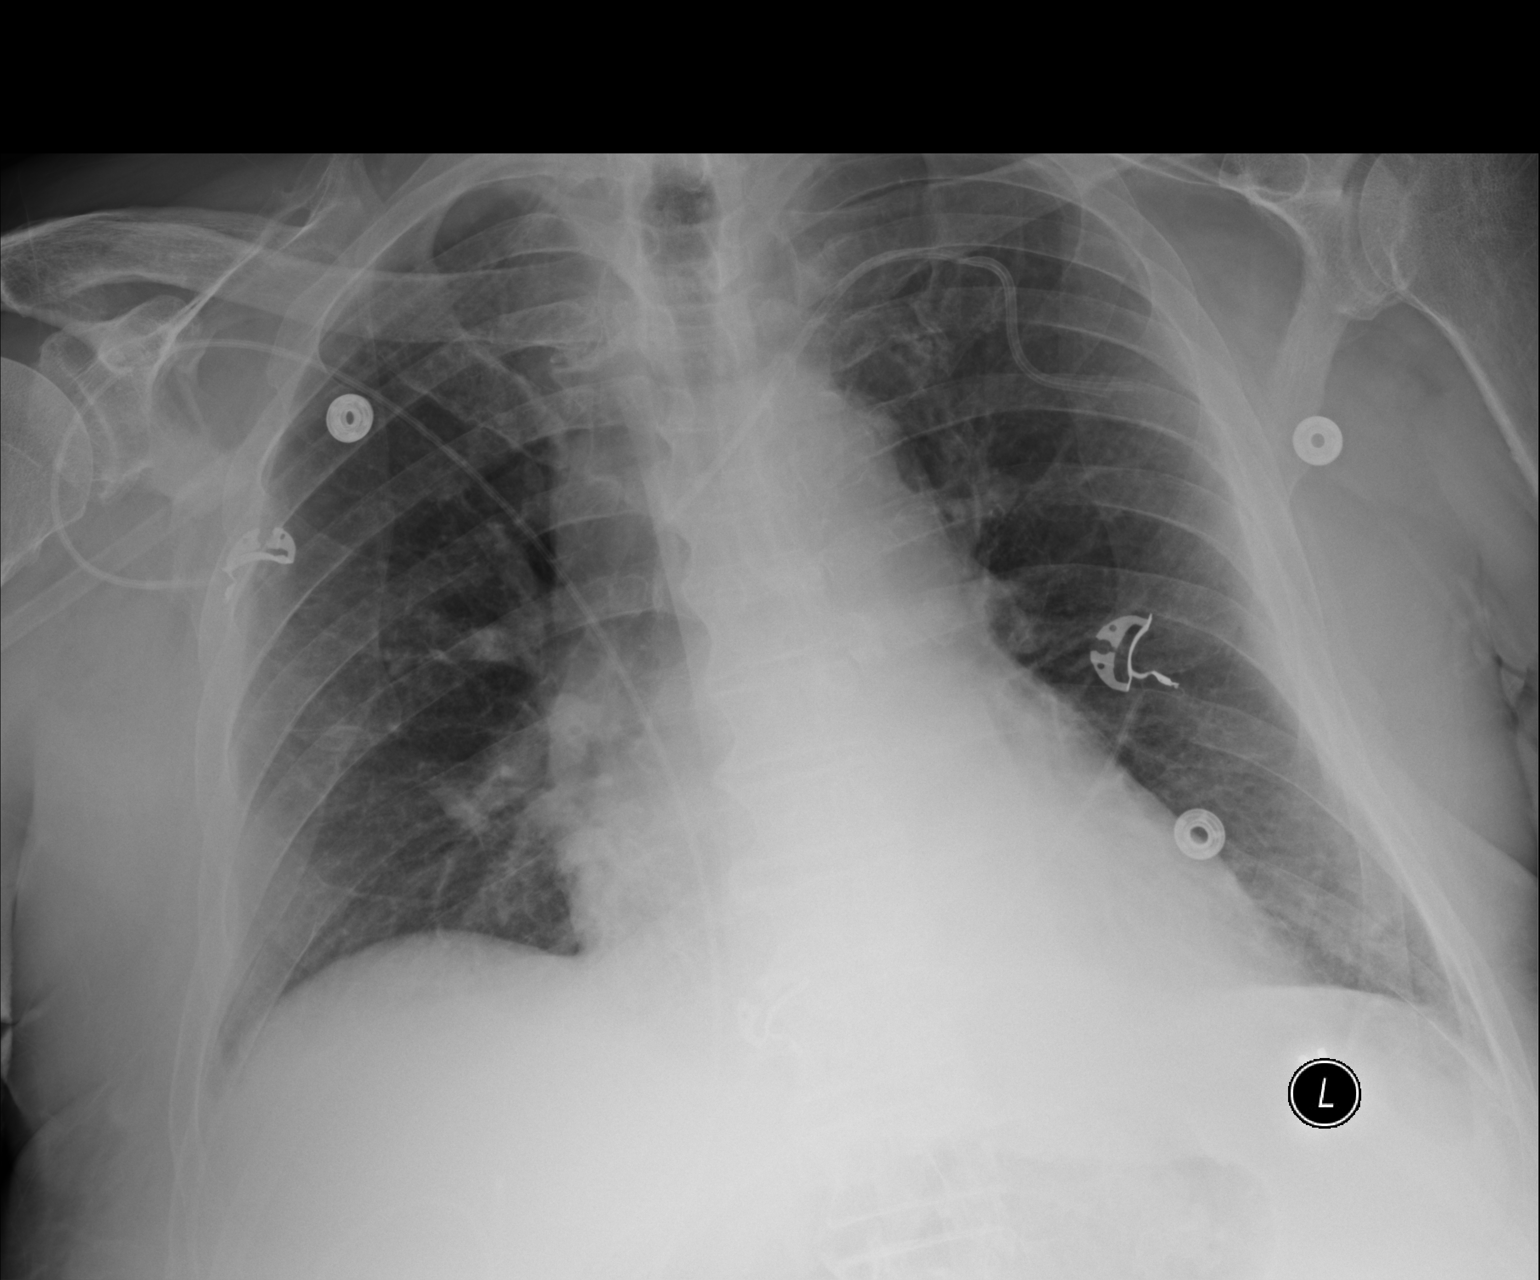

[1 of 1 positions shown; findings below may reference images not displayed]

FINDINGS: LEFT subclavian line stable tip projecting over brachiocephalic vein
at/near SVC confluence.

Enlargement of cardiac silhouette.

Mediastinal contours and pulmonary vascularity normal.

Minimal bibasilar atelectasis.

Upper lungs clear.

No pleural effusion or pneumothorax.

Scattered endplate spur formation thoracic spine.
IMPRESSION: Minimal bibasilar atelectasis without definite acute infiltrate.

## 2016-10-21 IMAGING — CR DG ABDOMEN 2V
2 series · 2 of 2 positions shown · non-contrast
Comparison: 06/01/2014

CLINICAL DATA: Constipation. Abdominal pain and nausea for 1 month.
Small bowel resection in May 2014.

EXAM:
ABDOMEN - 2 VIEW

[w abdomen upright *]
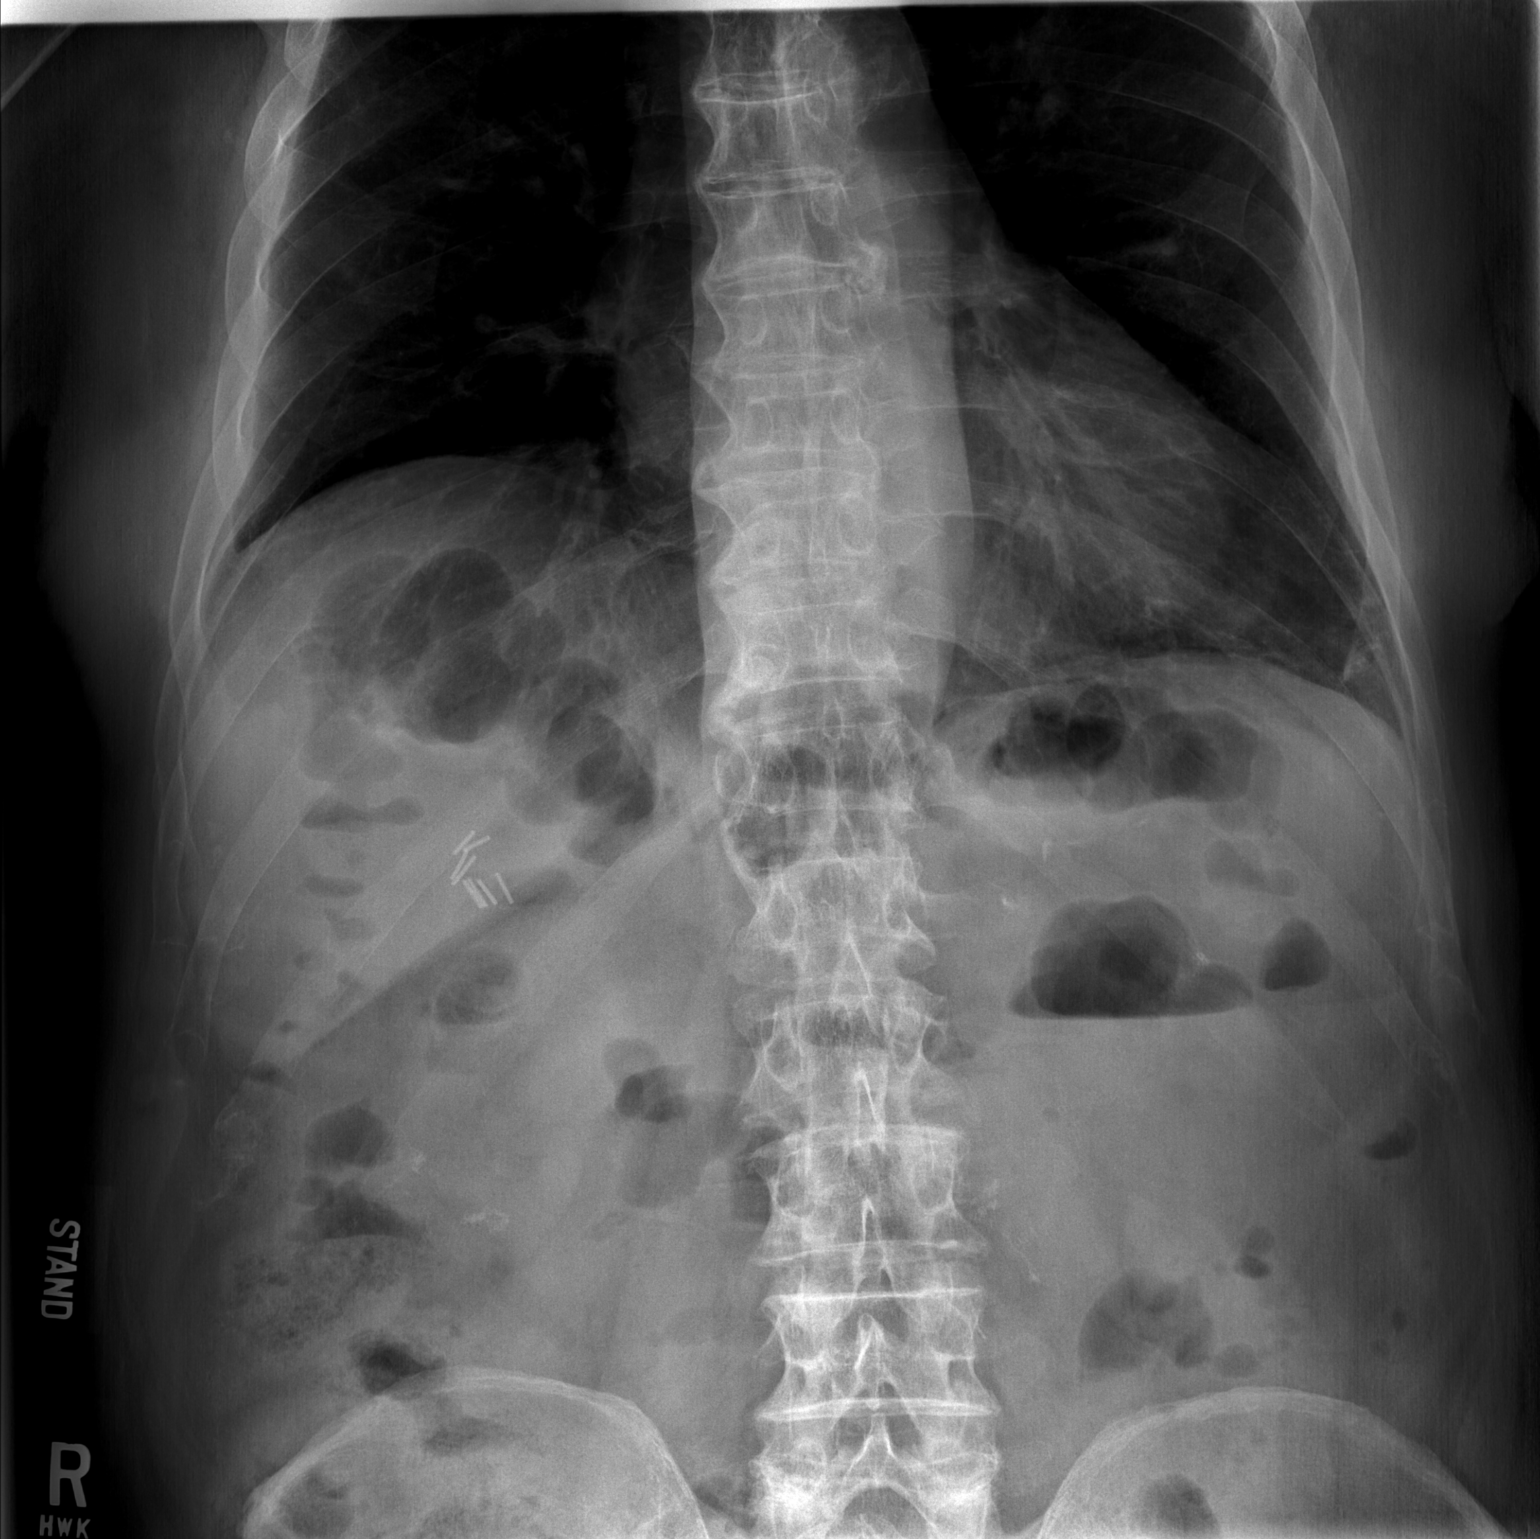

[t abdomen supine]
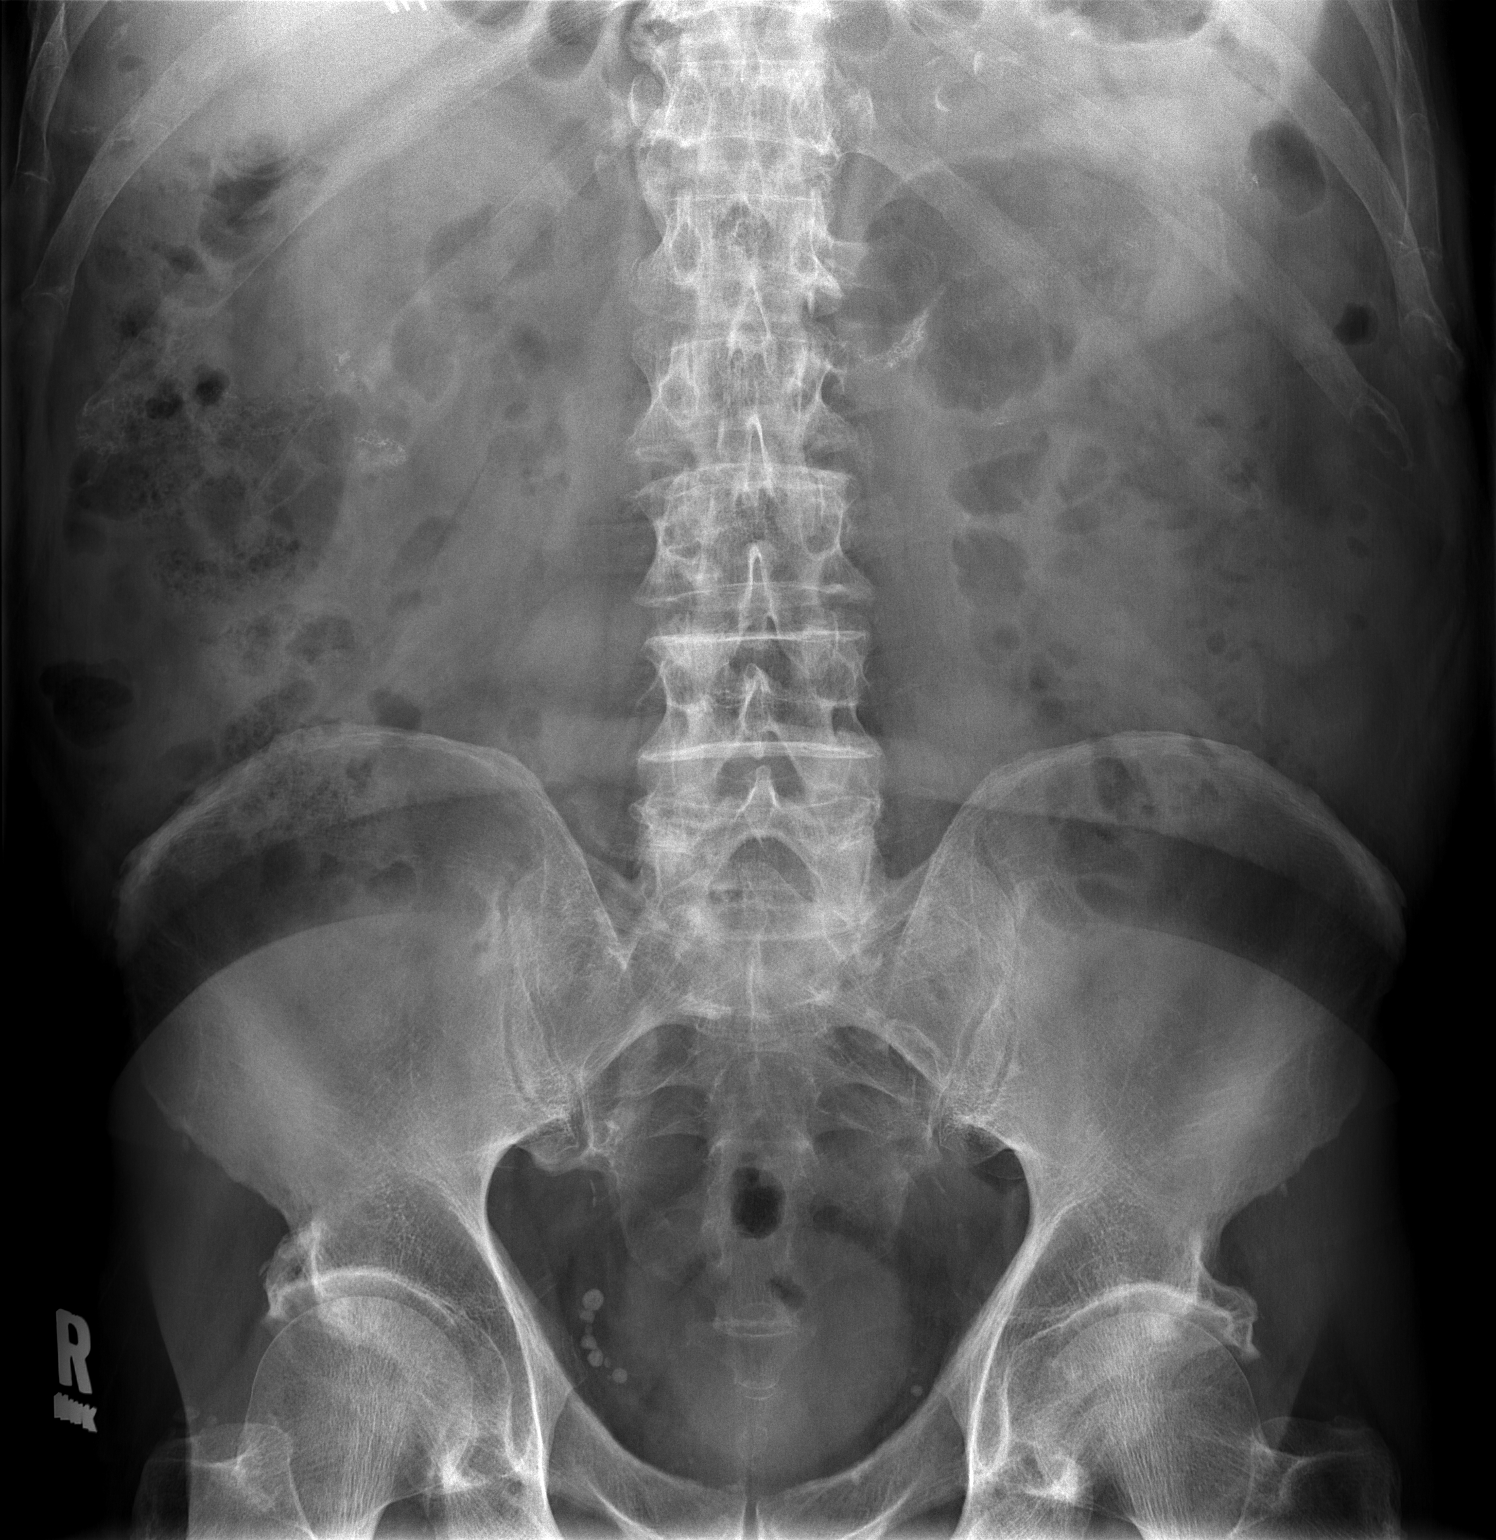

[2 of 2 positions shown; findings below may reference images not displayed]

FINDINGS: Mildly elevated right hemidiaphragm. Clips in the right upper
quadrant. Vascular calcifications noted. No free intraperitoneal gas
observed beneath the hemidiaphragms.

Scattered air-fluid levels are present in large and small bowel.
Bowel anastomotic clips noted in the left upper quadrant and in the
right abdomen.
IMPRESSION: 1. Abnormal but nonspecific bowel gas pattern with scattered
air-fluid levels in nondilated large and small bowel, possibly from
ileus, enterocolitis, or early obstruction. Patient previously had a
right groin hernia into which the sigmoid colon extended, but the
right groin region is not fully included on today's radiographs.
